# Patient Record
Sex: Female | Born: 1990 | Race: White | Hispanic: No | Marital: Married | Smoking: Never smoker
Health system: Southern US, Community
[De-identification: ages and names within clinical notes are randomized; demographics above are authoritative.]

## PROBLEM LIST (undated history)

## (undated) DIAGNOSIS — R011 Cardiac murmur, unspecified: Secondary | ICD-10-CM

## (undated) DIAGNOSIS — Q211 Atrial septal defect: Secondary | ICD-10-CM

## (undated) DIAGNOSIS — R002 Palpitations: Secondary | ICD-10-CM

## (undated) DIAGNOSIS — F419 Anxiety disorder, unspecified: Secondary | ICD-10-CM

## (undated) DIAGNOSIS — F32A Depression, unspecified: Secondary | ICD-10-CM

## (undated) HISTORY — DX: Depression, unspecified: F32.A

## (undated) HISTORY — DX: Atrial septal defect: Q21.1

## (undated) HISTORY — DX: Anxiety disorder, unspecified: F41.9

## (undated) HISTORY — DX: Palpitations: R00.2

## (undated) HISTORY — DX: Cardiac murmur, unspecified: R01.1

## (undated) HISTORY — PX: ASD REPAIR: SHX258

---
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
## 2014-05-05 ENCOUNTER — Ambulatory Visit (INDEPENDENT_AMBULATORY_CARE_PROVIDER_SITE_OTHER): Payer: BC Managed Care – PPO | Admitting: Cardiovascular Disease

## 2014-05-05 ENCOUNTER — Encounter: Payer: Self-pay | Admitting: Cardiovascular Disease

## 2014-05-05 DIAGNOSIS — R002 Palpitations: Secondary | ICD-10-CM

## 2014-05-05 DIAGNOSIS — Q211 Atrial septal defect, unspecified: Secondary | ICD-10-CM | POA: Insufficient documentation

## 2014-05-05 NOTE — Assessment & Plan Note (Signed)
3 years status post open ASD repair at [facility] 12/08/10 with a pericardial patch. She has had echoes since that time with documentation of return of her RV size. She no longer has dyspnea. She does have new onset palpitations. I cannot auscultate a murmur today. I'm going to get a 2-D echo with study to document successful closure and right-sided size and pressures.

## 2014-05-05 NOTE — Assessment & Plan Note (Signed)
New-onset palpitations over the last 6 months. The typically occur while she is at rest watching TV or at night before she goes to bed. They can last up to an hour at a time. There are no other associated symptoms. She drinks one cup of caffeine a day. She does not use stimulants. Her primary care physician to check lab work. I'm going to obtain a one-month event monitor and then we'll see her back.

## 2014-05-05 NOTE — Progress Notes (Signed)
05/05/2014 Kayla Everett   10/15/1990  [id number]  Primary Physician Astrid DivineGRIFFIN,ELAINE COLLINS, MD Primary Cardiologist: Runell GessJonathan J. Xaviera Flaten MD Roseanne RenoFACP,FACC,FAHA, FSCAI   HPI:  Kayla Everett is a delightful 23 year old thin and fit-appearing engaged Caucasian female with no children who is self-referred for evaluation of palpitations. Her father is Kayla Everett, a patient of mine as well. Her primary care physician is Dr. Maurice SmallElaine Griffin. 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 She actually got a four-year degree in [place] and equestrian science. She has no other medical problems. She is on no medications. She noticed dyspnea on exertion during her freshman year in college and went to see her school nurse who auscultated a murmur which led to a 2-D echo revealing an ASD. Ultimately this underwent surgical closure at the [facility] with a pericardial patch 12/08/10. She has done well since. She has noticed new onset palpitations over 6 months ago lasting up to one hour time. She drinks only a cup of caffeine a day and does not use other stimulants. There are no other associated symptoms. Her dyspnea has resolved since her surgery.   Current Outpatient Prescriptions  Medication Sig Dispense Refill  . MONONESSA 0.25-35 MG-MCG tablet Take 1 tablet by mouth daily.   0  . Multiple Vitamin (MULTIVITAMIN) capsule Take 1 capsule by mouth daily.     . Probiotic Product (PROBIOTIC DAILY PO) Take 1 tablet by mouth daily.     Marland Kitchen. zolpidem (AMBIEN) 5 MG tablet Take 5 mg by mouth at bedtime as needed.   0   No current facility-administered medications for this visit.    No Known Allergies  History   Social History  . Marital Status: Single    Spouse Name: N/A    Number of Children: N/A  . Years of Education: N/A   Occupational History  . Not on file.   Social History Main Topics  . Smoking status: Never Smoker   . Smokeless tobacco: Not on file  . Alcohol Use: Not on  file  . Drug Use: Not on file  . Sexual Activity: Not on file   Other Topics Concern  . Not on file   Social History Narrative  . No narrative on file     Review of Systems: General: negative for chills, fever, night sweats or weight changes.  Cardiovascular: negative for chest pain, dyspnea on exertion, edema, orthopnea, palpitations, paroxysmal nocturnal dyspnea or shortness of breath Dermatological: negative for rash Respiratory: negative for cough or wheezing Urologic: negative for hematuria Abdominal: negative for nausea, vomiting, diarrhea, bright red blood per rectum, melena, or hematemesis Neurologic: negative for visual changes, syncope, or dizziness All other systems reviewed and are otherwise negative except as noted above.    Blood pressure 100/68, pulse 65, height 5\' 2"  (1.575 m), weight 115 lb (52.164 kg).  General appearance: alert and no distress Neck: no adenopathy, no carotid bruit, no JVD, supple, symmetrical, trachea midline and thyroid not enlarged, symmetric, no tenderness/mass/nodules Lungs: clear to auscultation bilaterally Heart: regular rate and rhythm, S1, S2 normal, no murmur, click, rub or gallop Extremities: extremities normal, atraumatic, no cyanosis or edema  EKG normal sinus rhythm at 65 without ST or T-wave changes  ASSESSMENT AND PLAN:   Palpitations New-onset palpitations over the last 6 months. The typically occur while she is at rest watching TV or at night before she goes to bed. They can last up to an hour at a time. There are no  other associated symptoms. She drinks one cup of caffeine a day. She does not use stimulants. Her primary care physician to check lab work. I'm going to obtain a one-month event monitor and then we'll see her back.  Residual ASD (atrial septal defect) following repair 3 years status post open ASD repair at [facility] 12/08/10 with a pericardial patch. She has had echoes since that time with  documentation of return of her RV size. She no longer has dyspnea. She does have new onset palpitations. I cannot auscultate a murmur today. I'm going to get a 2-D echo with study to document successful closure and right-sided size and pressures.      Runell GessJonathan J. Ereka Brau MD FACP,FACC,FAHA, [facility] 05/05/2014 9:34 AM

## 2014-05-05 NOTE — Patient Instructions (Signed)
  We will see you back in follow up in 6 weeks with Dr Allyson SabalBerry.   Dr Allyson SabalBerry has ordered: 1.  Echocardiogram with bubble study. Echocardiography is a painless test that uses sound waves to create images of your heart. It provides your doctor with information about the size and shape of your heart and how well your heart's chambers and valves are working. This procedure takes approximately one hour. There are no restrictions for this procedure.  2.  Event monitor. Event monitors are medical devices that record the heart's electrical activity. Doctors most often us these monitors to diagnose arrhythmias. Arrhythmias are problems with the speed or rhythm of the heartbeat. The monitor is a small, portable device. You can wear one while you do your normal daily activities. This is usually used to diagnose what is causing palpitations/syncope (passing out).

## 2014-05-12 ENCOUNTER — Ambulatory Visit (HOSPITAL_COMMUNITY)
Admission: RE | Admit: 2014-05-12 | Discharge: 2014-05-12 | Disposition: A | Discharge status: Home / Self Care | Payer: BC Managed Care – PPO | Source: Ambulatory Visit | Attending: Cardiovascular Disease | Admitting: Cardiovascular Disease

## 2014-05-12 DIAGNOSIS — Q211 Atrial septal defect, unspecified: Secondary | ICD-10-CM

## 2014-05-12 DIAGNOSIS — I059 Rheumatic mitral valve disease, unspecified: Secondary | ICD-10-CM

## 2014-05-12 DIAGNOSIS — R002 Palpitations: Secondary | ICD-10-CM | POA: Insufficient documentation

## 2014-05-12 NOTE — Progress Notes (Signed)
2D Echo Performed 05/12/2014    Mayjor Ager, RCS  

## 2014-05-18 ENCOUNTER — Telehealth: Payer: Self-pay | Admitting: *Deleted

## 2014-05-18 NOTE — Telephone Encounter (Signed)
-----   Message from Runell GessJonathan J Berry, MD sent at 05/16/2014 11:20 AM EST ----- Essentially nl 2D with intact atrial septum, nl LV fxn, chiari network. I reviewed this with [place].

## 2014-05-18 NOTE — Telephone Encounter (Signed)
Spoke to pt. Notified her results were normal and that septum was intact.

## 2014-06-02 ENCOUNTER — Telehealth: Payer: Self-pay | Admitting: Cardiovascular Disease

## 2014-06-02 NOTE — Telephone Encounter (Signed)
Returned call to patient. She wanted to know if she will need to come to her f/up appmt in Jan (for echo and monitor) as she is now planning on moving to [place] before that date. She would like to be contacted with her monitor results once they are available and is OK to f/up after the monitor, if necessary (with extender? or Dr. Allyson SabalBerry if available).   Will route to [place], Charity fundraiserN as Lorain ChildesFYI

## 2014-06-02 NOTE — Telephone Encounter (Signed)
Pt wants to know if she have to come in for her appt for results of her monitor?She wants to know if Dr Chauncey MannBeryy could just talk to her over the phone?

## 2014-06-16 ENCOUNTER — Encounter: Payer: Self-pay | Admitting: Cardiovascular Disease

## 2014-06-16 ENCOUNTER — Ambulatory Visit (INDEPENDENT_AMBULATORY_CARE_PROVIDER_SITE_OTHER): Payer: BC Managed Care – PPO | Admitting: Cardiovascular Disease

## 2014-06-16 VITALS — BP 114/78 | HR 60 | Ht 62.0 in | Wt 116.4 lb

## 2014-06-16 DIAGNOSIS — Q211 Atrial septal defect, unspecified: Secondary | ICD-10-CM

## 2014-06-16 DIAGNOSIS — R002 Palpitations: Secondary | ICD-10-CM

## 2014-06-16 NOTE — Patient Instructions (Signed)
Please follow up with Dr. Berry as needed. 

## 2014-06-16 NOTE — Assessment & Plan Note (Signed)
History of ASD repair 12/08/10. Recent 2-D echocardiogram performed in our practice 05/12/14 revealed normal LV and RV size and function with intact interatrial septum and a negative bubble study.

## 2014-06-16 NOTE — Progress Notes (Signed)
Kayla Everett returns today for follow-up of her 2-D echo and event monitor. The echo was entirely normal suggesting intact interatrial septum with a negative bubble study, normal LV and RV size and function. The monitor showed what appears to be atrial tachycardia with rates up as high as 160-180. She is scheduled to move to [place] January 1 to be with her fiance. I have made an appointment for her to see Dr. Hillis RangeJames Allred tomorrow and I will see her back when necessary  Runell GessJonathan J. Berry, M.D., FACP, [facility]ACC, Earl LagosFAHA, [facility] [address] [place], [place]  [id number]  [phone] 06/16/2014 4:09 PM

## 2014-06-16 NOTE — Assessment & Plan Note (Signed)
Event monitor showed runs of what appears to be atrial tachycardia. I am referring her to Dr. Hillis RangeJames Allred for EP evaluation.

## 2014-06-17 ENCOUNTER — Encounter: Payer: Self-pay | Admitting: Internal Medicine

## 2014-06-17 ENCOUNTER — Telehealth: Payer: Self-pay | Admitting: Internal Medicine

## 2014-06-17 ENCOUNTER — Ambulatory Visit (INDEPENDENT_AMBULATORY_CARE_PROVIDER_SITE_OTHER): Payer: BC Managed Care – PPO | Admitting: Internal Medicine

## 2014-06-17 VITALS — BP 96/70 | HR 82 | Ht 62.0 in | Wt 115.8 lb

## 2014-06-17 DIAGNOSIS — Q211 Atrial septal defect, unspecified: Secondary | ICD-10-CM

## 2014-06-17 DIAGNOSIS — I471 Supraventricular tachycardia: Secondary | ICD-10-CM

## 2014-06-17 DIAGNOSIS — R002 Palpitations: Secondary | ICD-10-CM

## 2014-06-17 DIAGNOSIS — R000 Tachycardia, unspecified: Secondary | ICD-10-CM

## 2014-06-17 MED ORDER — VERAPAMIL HCL 40 MG PO TABS
ORAL_TABLET | ORAL | Status: DC
Start: 2014-06-17 — End: 2020-04-09

## 2014-06-17 NOTE — Telephone Encounter (Signed)
Incoming records received on 12[facility]16.15 [facility] for today's appointment with Dr. Johney FrameAllred: Given to the chart prep team on 12.16.15:djc

## 2014-06-17 NOTE — Patient Instructions (Addendum)
Your physician recommends that you schedule a follow-up appointment as needed  Your physician has recommended you make the following change in your medication:  1) Verapamil 40 mg take 1-2 by mouth as needed for fast heart rates as needed

## 2014-06-17 NOTE — Progress Notes (Deleted)
Primary Care Physician: Astrid DivineGRIFFIN,ELAINE COLLINS, MD Referring Physician: Dr. Leeanne RioBerry   Kayla Everett is a 23 y.o. female with a h/o ASD repair 6/12 and recent tachycardia, otherwise healthy, recently seen and referred by Dr. Allyson SabalBerry. She noticed palpitations the last 6-9 months when they first started up to an hour, lately having just a few minutes. She is aware of fluttering in her chest but no presyncope/syn, copy/dizziness. She recently had an echo with contrast which was negative. 30 day monitor did show a couple episodes with fast heart rate, appear to be sinus tachycardia, 160-180, but with one episode, she believes she was running and the other episode she does not remember her activity. She will usually notice palps with rest or stress more so than  activity.  Today, she denies symptoms of  chest pain, shortness of breath, orthopnea, PND, lower extremity edema, , presyncope, syncope, or neurologic sequela. Palpitations as above and intermittent dizziness with prolonged standing which she contributes to having low blood pressure. The patient is tolerating medications without difficulties and is otherwise without complaint today.   Past Medical History  Diagnosis Date  . Residual ASD (atrial septal defect) following repair june 2012    [facility]  . Palpitations    No past surgical history on file.  Current Outpatient Prescriptions  Medication Sig Dispense Refill  . MONONESSA 0.25-35 MG-MCG tablet Take 1 tablet by mouth daily.   0  . Multiple Vitamin (MULTIVITAMIN) capsule Take 1 capsule by mouth daily.     . Probiotic Product (PROBIOTIC DAILY PO) Take 1 tablet by mouth daily.     Marland Kitchen. zolpidem (AMBIEN) 5 MG tablet Take 5 mg by mouth at bedtime as needed for sleep.   0   No current facility-administered medications for this visit.    No Known Allergies  History   Social History  . Marital Status: Single    Spouse Name: N/A    Number of Children: N/A  . Years of Education:  N/A   Occupational History  . Not on file.   Social History Main Topics  . Smoking status: Never Smoker   . Smokeless tobacco: Not on file  . Alcohol Use: Not on file  . Drug Use: Not on file  . Sexual Activity: Not on file   Other Topics Concern  . Not on file   Social History Narrative    Family History  Problem Relation Age of Onset  . Hyperlipidemia Father   . Heart disease Father   . Hyperlipidemia Paternal Grandfather   . Heart disease Paternal Grandfather     ROS- All systems are reviewed and negative except as per the HPI above  Physical Exam: Filed Vitals:   06/17/14 1305  BP: 96/70  Pulse: 82  Height: 5\' 2"  (1.575 m)  Weight: 115 lb 12.8 oz (52.527 kg)    GEN- The patient is well appearing, alert and oriented x 3 today.   Head- normocephalic, atraumatic Eyes-  Sclera clear, conjunctiva pink Ears- hearing intact Oropharynx- clear Neck- supple, no JVP Lymph- no cervical lymphadenopathy Lungs- Clear to ausculation bilaterally, normal work of breathing Heart- Regular rate and rhythm, no murmurs, rubs or gallops, PMI not laterally displaced GI- soft, NT, ND, + BS Extremities- no clubbing, cyanosis, or edema MS- no significant deformity or atrophy Skin- no rash or lesion Psych- euthymic mood, full affect Neuro- strength and sensation are intact  30 day monitor reviewed and as described in the HPI.  Assessment and Plan: 1.  ASD s/p repair Recent echo without shunting.  2. Palpitations/tachycardia Currently minimally symptomatic. Monitor shows 1-2 episodes of sinus tachycardia, one of which may have been associated with exercise, the other she does not remember and was not symptomatic. Both heart rates within normal range of 23 yr old and not life threatening. No other work up in needed at this time and was given verapamil 40 mg to take 1-2 tabs as needed for palps. She is moving to [place] the first of the year and has been encouraged to seek out a EP  there if further issues.

## 2014-06-18 ENCOUNTER — Encounter: Payer: Self-pay | Admitting: Internal Medicine

## 2014-06-18 NOTE — Progress Notes (Signed)
Primary Care Physician: Astrid DivineGRIFFIN,ELAINE COLLINS, MD Referring Physician: Dr. Leeanne RioBerry   Kayla Everett is a 23 y.o. female with a h/o ASD repair 6/12 referred by Dr. Allyson SabalBerry for further evaluation of palpitations. She noticed palpitations 6-9 months ago.  She reports gradual onset/ offset of palpitations.  Episodes typically last only several minutes.  She describes symptoms of fluttering in her chest but no presyncope/syn, copy/dizziness. She recently had an echo which did not reveal any significant structural abnormality.  She recently had an event monitor placed by Dr Allyson SabalBerry which revealed long RP tachycardia (likely sinus tachycardia, 160-180 bpm), but with one episode, she believes she was running and the other episode she does not remember her activity.  She is unaware of triggers or precipitants for her symptoms. Today, she denies symptoms of  chest pain, shortness of breath, orthopnea, PND, lower extremity edema, , presyncope, syncope, or neurologic sequela. Palpitations as above and intermittent dizziness with prolonged standing which she contributes to having low blood pressure. The patient is tolerating medications without difficulties and is otherwise without complaint today.   Past Medical History  Diagnosis Date  . Residual ASD (atrial septal defect) following repair june 2012    [facility]  . Palpitations    No past surgical history on file.  Current Outpatient Prescriptions  Medication Sig Dispense Refill  . MONONESSA 0.25-35 MG-MCG tablet Take 1 tablet by mouth daily.   0  . Multiple Vitamin (MULTIVITAMIN) capsule Take 1 capsule by mouth daily.     . Probiotic Product (PROBIOTIC DAILY PO) Take 1 tablet by mouth daily.     Marland Kitchen. zolpidem (AMBIEN) 5 MG tablet Take 5 mg by mouth at bedtime as needed for sleep.   0   No current facility-administered medications for this visit.    No Known Allergies  History   Social History  . Marital Status: Single    Spouse Name: N/A   Number of Children: N/A  . Years of Education: N/A   Occupational History  . Not on file.   Social History Main Topics  . Smoking status: Never Smoker   . Smokeless tobacco: Not on file  . Alcohol Use: Not on file  . Drug Use: Not on file  . Sexual Activity: Not on file   Other Topics Concern  . Not on file   Social History Narrative    Family History  Problem Relation Age of Onset  . Hyperlipidemia Father   . Heart disease Father   . Hyperlipidemia Paternal Grandfather   . Heart disease Paternal Grandfather     ROS- All systems are reviewed and negative except as per the HPI above  Physical Exam: Filed Vitals:   06/17/14 1305  BP: 96/70  Pulse: 82  Height: 5\' 2"  (1.575 m)  Weight: 115 lb 12.8 oz (52.527 kg)    GEN- The patient is well appearing, alert and oriented x 3 today.   Head- normocephalic, atraumatic Eyes-  Sclera clear, conjunctiva pink Ears- hearing intact Oropharynx- clear Neck- supple, no JVP Lymph- no cervical lymphadenopathy Lungs- Clear to ausculation bilaterally, normal work of breathing Heart- Regular rate and rhythm, wide S2 split. no murmurs, rubs or gallops, PMI not laterally displaced GI- soft, NT, ND, + BS Extremities- no clubbing, cyanosis, or edema MS- no significant deformity or atrophy Skin- no rash or lesion Psych- euthymic mood, full affect Neuro- strength and sensation are intact  30 day monitor reviewed and as described in the HPI. Echo as above Epic  records including Dr Renelda MomBerrys notes are discussed.  I also spoke with Dr Allyson SabalBerry about the patient.  Assessment and Plan: 1. Tachypalpitations I have reviewed the patients event monitor.  This reveals long RP tachycardia.  I am most susipcious of sinus tachycardia but cannot rule out an atrial tachycardia, particularly with her history of ASD open closure.  Therapeutic strategies for supraventricular tachycardia including medicine and ablation were discussed in detail with the patient  today. Risk, benefits, and alternatives to EP study and radiofrequency ablation were also discussed in detail today.  She is clear that she is not interested in EPS or ablation.  She is also not interested in daily medicine.  Given the short and infrequent nature of her symptoms, I have advised veramapil 40mg  prn as a "pill in pocket" approach for her.  She is moving to [place] next month.  She will follow with cardiology as needed there.  1. S/p ASD repair Recent echo without shunting.  No further workup at this time.   I will see as needed going forward.  She is aware that she may contact my office if I can assist further in the future.

## 2014-07-10 ENCOUNTER — Ambulatory Visit: Payer: BC Managed Care – PPO | Admitting: Cardiovascular Disease

## 2020-03-03 LAB — OB RESULTS CONSOLE GC/CHLAMYDIA
Chlamydia: NEGATIVE
Gonorrhea: NEGATIVE

## 2020-03-03 LAB — OB RESULTS CONSOLE RPR: RPR: NONREACTIVE

## 2020-03-03 LAB — OB RESULTS CONSOLE ABO/RH: RH Type: NEGATIVE

## 2020-03-03 LAB — OB RESULTS CONSOLE ANTIBODY SCREEN: Antibody Screen: NEGATIVE

## 2020-03-03 LAB — OB RESULTS CONSOLE HEPATITIS B SURFACE ANTIGEN: Hepatitis B Surface Ag: NEGATIVE

## 2020-03-03 LAB — OB RESULTS CONSOLE RUBELLA ANTIBODY, IGM: Rubella: IMMUNE

## 2020-03-03 LAB — OB RESULTS CONSOLE GBS: GBS: POSITIVE

## 2020-03-03 LAB — OB RESULTS CONSOLE HIV ANTIBODY (ROUTINE TESTING): HIV: NONREACTIVE

## 2020-03-15 ENCOUNTER — Ambulatory Visit: Payer: Self-pay | Admitting: Cardiovascular Disease

## 2020-03-24 ENCOUNTER — Ambulatory Visit: Payer: Self-pay | Admitting: Nurse Practitioner

## 2020-04-04 ENCOUNTER — Other Ambulatory Visit: Payer: Self-pay

## 2020-04-09 ENCOUNTER — Other Ambulatory Visit: Payer: Self-pay

## 2020-04-09 ENCOUNTER — Encounter: Payer: Self-pay | Admitting: Cardiovascular Disease

## 2020-04-09 ENCOUNTER — Ambulatory Visit (INDEPENDENT_AMBULATORY_CARE_PROVIDER_SITE_OTHER): Payer: PRIVATE HEALTH INSURANCE | Admitting: Cardiovascular Disease

## 2020-04-09 VITALS — BP 133/79 | HR 60 | Ht 62.0 in | Wt 123.0 lb

## 2020-04-09 DIAGNOSIS — Q211 Atrial septal defect, unspecified: Secondary | ICD-10-CM

## 2020-04-09 DIAGNOSIS — R002 Palpitations: Secondary | ICD-10-CM

## 2020-04-09 NOTE — Assessment & Plan Note (Signed)
History of palpitations in the past.  She thought that most this was related to anxiety.  She did have a event monitor that showed long RP tachycardia and was seen by Dr. Johney Frame who thought that she may have an atrial tachycardia.  He discussed medications and/or ablation both of which were not pursued.  She is now 4 months pregnant and has noticed recurrent palpitations which I suspect is related to her pregnancy.

## 2020-04-09 NOTE — Progress Notes (Signed)
04/09/2020 Kayla Everett   02/24/1991  [id number]  Primary Physician Maurice Small, MD Primary Cardiologist: Runell Gess MD Nicholes Calamity, [place]  HPI:  Kayla Everett is a 29 y.o.  thin and fit-appearing married Caucasian female who is 4 months pregnant and was self-referred initially for evaluation of palpitations. Her father is Kayla Everett, a patient of mine as well. Her primary care physician is Dr. Maurice Small. She works as a Air traffic controller. She actually got a four-year degree in [place] and equestrian science. She has no other medical problems. She is on no medications. She noticed dyspnea on exertion during her freshman year in college and went to see her school nurse who auscultated a murmur which led to a 2-D echo revealing an ASD. Ultimately this underwent surgical closure at the [facility] with a pericardial patch 12/08/10. She has done well since. She has noticed new onset palpitations over 6 months ago lasting up to one hour time. She drinks only a cup of caffeine a day and does not use other stimulants. There are no other associated symptoms. Her dyspnea has resolved since her surgery.  Since I saw her 6 years ago she has since gotten married.  She moved back to [place].  I did do a 2D echo on her 05/12/2014 that showed no interatrial shunt by bubble study and normal LV function with moderate left atrial enlargement.  She is currently working as a Energy manager.  She has had some dyspnea probably as result of her pregnancy and some increased palpitations as well.   Current Meds  Medication Sig  . Prenatal Vit-Fe Fumarate-FA (MULTIVITAMIN-PRENATAL) 27-0.8 MG TABS tablet Take 1 tablet by mouth daily at 12 noon.     No Known Allergies  Social History   Socioeconomic History  . Marital status: Single    Spouse name: Not on file  . Number of children: Not on file  . Years of education: Not on file  . Highest  education level: Not on file  Occupational History  . Not on file  Tobacco Use  . Smoking status: Never Smoker  . Smokeless tobacco: Never Used  Substance and Sexual Activity  . Alcohol use: No    Alcohol/week: 0.0 standard drinks  . Drug use: No  . Sexual activity: Not on file  Other Topics Concern  . Not on file  Social History Narrative  . Not on file   Social Determinants of Health   Financial Resource Strain:   . Difficulty of Paying Living Expenses: Not on file  Food Insecurity:   . Worried About Programme researcher, broadcasting/film/video in the Last Year: Not on file  . Ran Out of Food in the Last Year: Not on file  Transportation Needs:   . Lack of Transportation (Medical): Not on file  . Lack of Transportation (Non-Medical): Not on file  Physical Activity:   . Days of Exercise per Week: Not on file  . Minutes of Exercise per Session: Not on file  Stress:   . Feeling of Stress : Not on file  Social Connections:   . Frequency of Communication with Friends and Family: Not on file  . Frequency of Social Gatherings with Friends and Family: Not on file  . Attends Religious Services: Not on file  . Active Member of Clubs or Organizations: Not on file  . Attends Banker Meetings: Not on file  . Marital Status: Not on  file  Intimate Partner Violence:   . Fear of Current or Ex-Partner: Not on file  . Emotionally Abused: Not on file  . Physically Abused: Not on file  . Sexually Abused: Not on file     Review of Systems: General: negative for chills, fever, night sweats or weight changes.  Cardiovascular: negative for chest pain, dyspnea on exertion, edema, orthopnea, palpitations, paroxysmal nocturnal dyspnea or shortness of breath Dermatological: negative for rash Respiratory: negative for cough or wheezing Urologic: negative for hematuria Abdominal: negative for nausea, vomiting, diarrhea, bright red blood per rectum, melena, or hematemesis Neurologic: negative for visual  changes, syncope, or dizziness All other systems reviewed and are otherwise negative except as noted above.    Blood pressure 133/79, pulse 60, height 5\' 2"  (1.575 m), weight 123 lb (55.8 kg), last menstrual period 12/17/2019, SpO2 100 %.  General appearance: alert and no distress Neck: no adenopathy, no carotid bruit, no JVD, supple, symmetrical, trachea midline and thyroid not enlarged, symmetric, no tenderness/mass/nodules Lungs: clear to auscultation bilaterally Heart: regular rate and rhythm, S1, S2 normal, no murmur, click, rub or gallop Extremities: extremities normal, atraumatic, no cyanosis or edema Pulses: 2+ and symmetric Skin: Skin color, texture, turgor normal. No rashes or lesions Neurologic: Alert and oriented X 3, normal strength and tone. Normal symmetric reflexes. Normal coordination and gait  EKG sinus rhythm at 60 with rightward axis with nonspecific ST and T wave changes.  I personally reviewed this EKG.  ASSESSMENT AND PLAN:   Palpitations History of palpitations in the past.  She thought that most this was related to anxiety.  She did have a event monitor that showed long RP tachycardia and was seen by Dr. 12/19/2019 who thought that she may have an atrial tachycardia.  He discussed medications and/or ablation both of which were not pursued.  She is now 4 months pregnant and has noticed recurrent palpitations which I suspect is related to her pregnancy.  Residual ASD (atrial septal defect) following repair History of ASD repair via open closure at [facility] in [facility] treated with a pericardial patch 12/08/2010.  She had a 2D echo performed by 02/07/2011 05/12/2014 that showed normal LV function without evidence of an interatrial shunt by bubble study.  Multiple echo was performed while she was in 13/04/2014 again showed essentially normal 2D echo with moderate left atrial enlargement and no evidence of intra-atrial shunt.  We will recheck a 2D echo in 6 months when I  see her back.      [place] MD FACP,FACC,FAHA, [facility] 04/09/2020 2:37 PM

## 2020-04-09 NOTE — Patient Instructions (Signed)

## 2020-04-09 NOTE — Assessment & Plan Note (Signed)
History of ASD repair via open closure at [facility] in [place] treated with a pericardial patch 12/08/2010.  She had a 2D echo performed by [place] 05/12/2014 that showed normal LV function without evidence of an interatrial shunt by bubble study.  Multiple echo was performed while she was in [place] again showed essentially normal 2D echo with moderate left atrial enlargement and no evidence of intra-atrial shunt.  We will recheck a 2D echo in 6 months when I see her back.

## 2020-04-20 ENCOUNTER — Encounter: Payer: Self-pay | Admitting: Physical Therapy

## 2020-04-20 ENCOUNTER — Other Ambulatory Visit: Payer: Self-pay

## 2020-04-20 ENCOUNTER — Ambulatory Visit: Payer: PRIVATE HEALTH INSURANCE | Attending: Obstetrics and Gynecology | Admitting: Physical Therapy

## 2020-04-20 DIAGNOSIS — M6281 Muscle weakness (generalized): Secondary | ICD-10-CM | POA: Diagnosis present

## 2020-04-20 DIAGNOSIS — M545 Low back pain, unspecified: Secondary | ICD-10-CM | POA: Diagnosis not present

## 2020-04-20 DIAGNOSIS — G8929 Other chronic pain: Secondary | ICD-10-CM

## 2020-04-20 DIAGNOSIS — R278 Other lack of coordination: Secondary | ICD-10-CM

## 2020-04-20 DIAGNOSIS — M25551 Pain in right hip: Secondary | ICD-10-CM

## 2020-04-20 DIAGNOSIS — M25552 Pain in left hip: Secondary | ICD-10-CM | POA: Diagnosis present

## 2020-04-20 NOTE — Therapy (Signed)
[facility] [address], [address] [place], [place], [address] Phone: [phone]   Fax:  [phone]  Physical Therapy Evaluation  Patient Details  Name: Kayla Everett MRN: [id number] Date of Birth: 1991/04/18 Referring Provider (PT): Nigel Bridgeman, [place]   Encounter Date: 04/20/2020    Past Medical History:  Diagnosis Date  . Palpitations   . Residual ASD (atrial septal defect) following repair june 2012   [facility]    Past Surgical History:  Procedure Laterality Date  . ASD REPAIR      There were no vitals filed for this visit.    Subjective Assessment - 04/20/20 1441    Subjective Pt is referred to OPPT to learn how to manage PF muscle dysfunction as she preps for childbirth in 08/2020.  She is currently [redacted]w[redacted]d pregnant.  She rides horses 3-5x/week.  Pt has bil SI joint pain which locks up and LBP x years. 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  Riding sometimes helps pain.  Prolonged sitting is aggravting.    Pertinent History ASD repair, currently pregnant, scoliosis    Limitations Sitting    How long can you sit comfortably? depends on the day    Patient Stated Goals manage symptoms    Currently in Pain? Yes    Pain Score 1     Pain Location Back    Pain Orientation Right;Left    Pain Descriptors / Indicators Aching;Dull    Pain Type Chronic pain    Pain Radiating Towards SI joints and hips    Pain Onset More than a month ago    Pain Frequency Intermittent    Aggravating Factors  prolonged sitting    Pain Relieving Factors heat, stretching, exercise              [facility] PT Assessment - 04/20/20 0001      Assessment   Medical Diagnosis M62.9 (ICD-10-CM) - Disorder of muscle, unspecified    Referring Provider (PT) Nigel Bridgeman, CNM    Onset Date/Surgical Date --   years of SI joint dysfunction, currently pregnant [redacted]w[redacted]d   Next MD Visit --   next week   Prior Therapy yes chiro and  PF PT      Precautions   Precautions Other (comment)    Precaution Comments pregnant      Restrictions   Weight Bearing Restrictions No      Balance Screen   Has the patient fallen in the past 6 months No      Home Environment   Living Environment Private residence    Living Arrangements Spouse/significant other    Type of Home House    Additional Comments pain with stairs if really flared up      Prior Function   Level of Independence Independent    Vocation Full time employment    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    Leisure horseback riding, walk, travel      Cognition   Overall Cognitive Status Within Functional Limits for tasks assessed      Functional Tests   Functional tests Single leg stance      Single Leg Stance   Comments + Trendelenburg in Lt SLS      Posture/Postural Control   Posture Comments mild scoliosis present      ROM / Strength   AROM / PROM / Strength AROM;Strength;PROM      AROM   Overall AROM Comments trunk limited  bil SB 10 deg each, Rt SI pain with Lt SB      PROM   Overall PROM Comments WNL      Strength   Overall Strength Comments Lt hip IR/ER 4-/5, Lt closed chain hip 4-/5, Lt lumbar MF atrophy present L3-S1      Flexibility   Soft Tissue Assessment /Muscle Length yes   adductors and hamstrings limited Rt compared to Lt end range     Palpation   SI assessment  unlocks on Lt in SLS with + Trendelenburg on Lt, pain improves with manual approx in supine, Rt SI joint limited glide    Palpation comment Lt>Rt lateral hip tenderness with TPs in glute min/med and piriformis                      Objective measurements completed on examination: See above findings.     Pelvic Floor Special Questions - 04/20/20 0001    Prior Pelvic/Prostate Exam Yes    Date of Last Pelvic/Prostate Exam --   2019   Result Pelvic/Prostate Exam  normal    Are you Pregnant or attempting pregnancy? Yes    Prior Pregnancies No    Number  of Pregnancies 1    Currently Sexually Active Yes    Is this Painful No    History of sexually transmitted disease No    Urinary Leakage No    Urinary urgency No    Urinary frequency yes    Fecal incontinence No    Fluid intake water    Caffeine beverages 1 cup    Falling out feeling (prolapse) No                      PT Short Term Goals - 04/20/20 1732      PT SHORT TERM GOAL #1   Title Pt will be ind with Rt LE flexibility aspect of HEP to reduce undue strain through Rt hemipelvis.    Time 3    Period Weeks    Status New    Target Date 05/11/20      PT SHORT TERM GOAL #2   Title Pt will demo improved Lt hemipelvic stability in closed chain SLS without Lt SI joint unlocking and eliminated Trendelenburg.    Time 4    Period Weeks    Status New    Target Date 05/18/20             PT Long Term Goals - 04/20/20 1734      PT LONG TERM GOAL #1   Title Pt will be ind with advanced HEP and understand how to safely progress    Time 12    Period Weeks    Status New    Target Date 07/13/20      PT LONG TERM GOAL #2   Title Pt will report at least 50% reduction in pain intensity and frequency with daily activities.    Time 12    Period Weeks    Status New    Target Date 07/13/20      PT LONG TERM GOAL #3   Title Pt will be educated on ways to support her spine and pelvis as her pregnancy progresses to protect her from pain and maximize her safety with mobility.    Time 12    Period Weeks    Status New    Target Date 07/13/20      PT LONG TERM GOAL #4  Title Pt will achieve Lt hip strength in rotators and in closed chain functional loading to at least 4+/5 for improved transfers and stairs    Time 12    Period Weeks    Status New    Target Date 07/13/20                  Plan - 04/20/20 1721    Clinical Impression Statement Pt is a healthy 29yo female who is 7369w6d pregnant with her first child.  She is an avid horseback rider with a long  history of LBP, bil SI joint dysfunction and bil hip pain.  She plans to continue riding 3-5 days a week as able during her pregnancy.  She has no symptoms of dysfunction of pelvic floor muscles (leakage, pain) and PT was able to palpate good contract and bulge through clothing in SL today.  Pt presents with weakness in Lt hip and difficulty stabilizing Lt hip and SI joint (unlocks) in closed chain.  She has + Trendelenburg on Lt with SLS.  She has trigger points in Lt lateral hip in glut min/med and piriformis today.  Rt SI joint is mildly compressed.  LE flexibility is limited on Rt in hamstrings and adductors.  She has some atrophy and weakness in Lt lumbar multifidi.  She has mild scoliosis.  She has had success in past with ART and stretching/ther ex to help prevent her SI joints from locking up.  Today she felt relief with Lt manual SI joint compression so strengthening and tape/SI belt may be helpful to her.  She will benefit from skilled PT to address her symptoms and help manage her pain and stability as her pregnancy progresses.    Personal Factors and Comorbidities Comorbidity 1    Comorbidities open heart surgery when younger, considered high risk pregnancy due to this history    Examination-Activity Limitations Sit;Bend;Stairs    Examination-Participation Restrictions Driving;Community Activity    Stability/Clinical Decision Making Stable/Uncomplicated    Clinical Decision Making Low    Rehab Potential Excellent    PT Frequency 1x / week    PT Duration 12 weeks    PT Treatment/Interventions ADLs/Self Care Home Management;Joint Manipulations;Spinal Manipulations;Taping;Dry needling;Passive range of motion;Manual techniques;Neuromuscular re-education;Therapeutic exercise;Therapeutic activities;Functional mobility training;Stair training;Biofeedback;Moist Heat;Cryotherapy    PT Next Visit Plan Rt ham/adductor stretch and butterfly (add to HEP), STM/release Lt lateral hip then work on Lt SI  stabilization, bil lumbar myofascial release and ball rollouts, try SI belt    PT Home Exercise Plan Access Code: [email or web address] (clam, reverse clam, standing weight shifts/SLS foot on step)    Consulted and Agree with Plan of Care Patient           Patient will benefit from skilled therapeutic intervention in order to improve the following deficits and impairments:  Decreased coordination, Decreased range of motion, Increased fascial restricitons, Pain, Increased muscle spasms, Decreased strength, Postural dysfunction, Impaired flexibility, Improper body mechanics  Visit Diagnosis: Chronic bilateral low back pain without sciatica - Plan: PT plan of care cert/re-cert  Muscle weakness (generalized) - Plan: PT plan of care cert/re-cert  Other lack of coordination - Plan: PT plan of care cert/re-cert  Pain in left hip - Plan: PT plan of care cert/re-cert  Pain in right hip - Plan: PT plan of care cert/re-cert     Problem List Patient Active Problem List   Diagnosis Date Noted  . Palpitations 05/05/2014  . Residual ASD (atrial septal defect) following repair 05/05/2014  Loistine Simas Bunnie Rehberg, PT 04/20/20 5:40 PM   [facility] [address], [address] [place], [place], [address] Phone: [phone]   Fax:  [phone]  Name: Kayla Everett MRN: [id number] Date of Birth: 1990/11/17

## 2020-04-20 NOTE — Patient Instructions (Signed)
Access Code: [email or web address] URL: [email or web address] Date: 04/20/2020 Prepared by: Loistine Simas Mischelle Reeg  Exercises Clamshell - 1 x daily - 7 x weekly - 2 sets - 10 reps Sidelying Reverse Clamshell - 1 x daily - 7 x weekly - 2 sets - 10 reps Standing Weight Shift - 1 x daily - 7 x weekly - 1 sets - 20 reps

## 2020-04-21 ENCOUNTER — Ambulatory Visit: Payer: Self-pay | Admitting: Nurse Practitioner

## 2020-04-29 ENCOUNTER — Encounter: Payer: Self-pay | Admitting: Physical Therapy

## 2020-04-29 ENCOUNTER — Other Ambulatory Visit: Payer: Self-pay

## 2020-04-29 ENCOUNTER — Ambulatory Visit: Payer: PRIVATE HEALTH INSURANCE | Admitting: Physical Therapy

## 2020-04-29 DIAGNOSIS — M25551 Pain in right hip: Secondary | ICD-10-CM

## 2020-04-29 DIAGNOSIS — R278 Other lack of coordination: Secondary | ICD-10-CM

## 2020-04-29 DIAGNOSIS — M545 Low back pain, unspecified: Secondary | ICD-10-CM

## 2020-04-29 DIAGNOSIS — M6281 Muscle weakness (generalized): Secondary | ICD-10-CM

## 2020-04-29 DIAGNOSIS — M25552 Pain in left hip: Secondary | ICD-10-CM

## 2020-04-29 DIAGNOSIS — G8929 Other chronic pain: Secondary | ICD-10-CM

## 2020-04-29 NOTE — Patient Instructions (Signed)
Access Code: [email or web address] URL: [email or web address] Date: 04/29/2020 Prepared by: Loistine Simas Macel Yearsley  Exercises Clamshell - 1 x daily - 7 x weekly - 2 sets - 10 reps Sidelying Reverse Clamshell - 1 x daily - 7 x weekly - 2 sets - 10 reps Standing Weight Shift - 1 x daily - 7 x weekly - 1 sets - 20 reps Beginner Bridge - 1 x daily - 7 x weekly - 1 sets - 10 reps Hooklying Isometric Clamshell - 1 x daily - 7 x weekly - 1 sets - 5 reps Supine Bilateral Hip Internal Rotation Stretch - 1 x daily - 7 x weekly - 1 sets - 1 reps - 30 hold Supine Hamstring Stretch with Strap - 1 x daily - 7 x weekly - 1 sets - 1 reps - 30 hold

## 2020-04-29 NOTE — Therapy (Signed)
[facility] [address], [address] [place], [place], [address] Phone: [phone]   Fax:  [phone]  Physical Therapy Treatment  Patient Details  Name: Kayla Everett MRN: [id number] Date of Birth: 09-08-1990 Referring Provider (PT): Nigel Bridgeman, [place]   Encounter Date: 04/29/2020   PT End of Session - 04/29/20 1535    Visit Number 2    Number of Visits 20    Date for PT Re-Evaluation 07/13/20    Authorization Type Generic Commercial    Authorization Time Period through 07/02/20    Authorization - Visit Number 2    Authorization - Number of Visits 20    PT Start Time 1536    PT Stop Time 1615    PT Time Calculation (min) 39 min    Activity Tolerance Patient tolerated treatment well    Behavior During Therapy [facility] for tasks assessed/performed           Past Medical History:  Diagnosis Date  . Palpitations   . Residual ASD (atrial septal defect) following repair june 2012   [facility]    Past Surgical History:  Procedure Laterality Date  . ASD REPAIR      There were no vitals filed for this visit.   Subjective Assessment - 04/29/20 1536    Subjective No pain today - just Lt lower SI joint tightness.  I'm really tired and we found I have low vit D.  Will be taking supplements.    Pertinent History ASD repair, currently pregnant, scoliosis    How long can you sit comfortably? depends on the day    Patient Stated Goals manage symptoms    Currently in Pain? No/denies                             [facility] Adult PT Treatment/Exercise - 04/29/20 0001      Self-Care   Self-Care Other Self-Care Comments    Other Self-Care Comments  trial of demo [place]ola belt, gave info should Pt wish to purchace      Exercises   Exercises Lumbar;Knee/Hip      Lumbar Exercises: Stretches   Active Hamstring Stretch Left;Right;1 rep;30 seconds    Active Hamstring Stretch Limitations with yoga strap, added  adductors 1x30 bil    Other Lumbar Stretch Exercise supine butterfly with pillow supports at knees, ribcage expansion breaths x 5 cycles    Other Lumbar Stretch Exercise supine hip IR stretch Rt/Lt 1x30 each      Lumbar Exercises: Supine   Bridge Compliant;10 reps    Other Supine Lumbar Exercises single leg supine clam yellow loop band with focus on square pelvis and stabilizing hip x 10 bil      Lumbar Exercises: Sidelying   Clam Left;Right;Both;5 reps    Clam Limitations and reverse clam (blue loop), yellow loop clam      Lumbar Exercises: Quadruped   Madcat/Old Horse 5 reps    Other Quadruped Lumbar Exercises quadruped sidebending with lateral costal expansion x 3 breath cycles each, 2 rounds    Other Quadruped Lumbar Exercises child's pose neutral and with SB bil 1x20 sec each way      Manual Therapy   Manual Therapy Soft tissue mobilization    Soft tissue mobilization Lt gluteals and piriformis                  PT Education - 04/29/20 1606    Education  Details Access Code: [email or web address]    Person(s) Educated Patient    Methods Explanation;Handout    Comprehension Verbalized understanding            PT Short Term Goals - 04/20/20 1732      PT SHORT TERM GOAL #1   Title Pt will be ind with Rt LE flexibility aspect of HEP to reduce undue strain through Rt hemipelvis.    Time 3    Period Weeks    Status New    Target Date 05/11/20      PT SHORT TERM GOAL #2   Title Pt will demo improved Lt hemipelvic stability in closed chain SLS without Lt SI joint unlocking and eliminated Trendelenburg.    Time 4    Period Weeks    Status New    Target Date 05/18/20             PT Long Term Goals - 04/20/20 1734      PT LONG TERM GOAL #1   Title Pt will be ind with advanced HEP and understand how to safely progress    Time 12    Period Weeks    Status New    Target Date 07/13/20      PT LONG TERM GOAL #2   Title Pt will report at least 50% reduction in pain  intensity and frequency with daily activities.    Time 12    Period Weeks    Status New    Target Date 07/13/20      PT LONG TERM GOAL #3   Title Pt will be educated on ways to support her spine and pelvis as her pregnancy progresses to protect her from pain and maximize her safety with mobility.    Time 12    Period Weeks    Status New    Target Date 07/13/20      PT LONG TERM GOAL #4   Title Pt will achieve Lt hip strength in rotators and in closed chain functional loading to at least 4+/5 for improved transfers and stairs    Time 12    Period Weeks    Status New    Target Date 07/13/20                 Plan - 04/29/20 1733    Clinical Impression Statement Pt arrived without pain but some stiffness in Lt SI joint.  She has fatigue related to discovery of low Vit D for which she will start taking supplements per her MD.  PT initiated HEP for LE and trunk stretching, encouraging ribcage expansion breathing during trunk stretches.  PT also initiated hip stabilization and strength for rotators.  Pt initially fatigued with Lt hip stabilization clam in supine but this improved with reps.  Good tolerance of all ther ex today.  More focus on manual techniques and hip stability progression next visit if ther ex going well.    Comorbidities open heart surgery when younger, considered high risk pregnancy due to this history    PT Frequency 1x / week    PT Duration 12 weeks    PT Next Visit Plan f/u on HEP and fatigue levels, STM bil trunk and hips, try sit to stand with hip abd band, monster walks, check Lt SLS/weight shifts    PT Home Exercise Plan Access Code: [email or web address]    Consulted and Agree with Plan of Care Patient           Patient will  benefit from skilled therapeutic intervention in order to improve the following deficits and impairments:     Visit Diagnosis: Chronic bilateral low back pain without sciatica  Muscle weakness (generalized)  Other lack of  coordination  Pain in left hip  Pain in right hip     Problem List Patient Active Problem List   Diagnosis Date Noted  . Palpitations 05/05/2014  . Residual ASD (atrial septal defect) following repair 05/05/2014    Morton Peters, PT 04/29/20 5:37 PM    [facility] [address], [address] [place], [place], [address] Phone: [phone]   Fax:  [phone]  Name: Kayla Everett MRN: [id number] Date of Birth: 1991-01-16

## 2020-05-04 ENCOUNTER — Ambulatory Visit (HOSPITAL_BASED_OUTPATIENT_CLINIC_OR_DEPARTMENT_OTHER): Payer: PRIVATE HEALTH INSURANCE

## 2020-05-04 ENCOUNTER — Ambulatory Visit: Payer: PRIVATE HEALTH INSURANCE | Attending: Obstetrics and Gynecology | Admitting: Obstetrics

## 2020-05-04 ENCOUNTER — Other Ambulatory Visit: Payer: Self-pay

## 2020-05-04 ENCOUNTER — Other Ambulatory Visit: Payer: Self-pay | Admitting: *Deleted

## 2020-05-04 ENCOUNTER — Ambulatory Visit: Payer: PRIVATE HEALTH INSURANCE | Admitting: *Deleted

## 2020-05-04 ENCOUNTER — Encounter: Payer: Self-pay | Admitting: *Deleted

## 2020-05-04 ENCOUNTER — Other Ambulatory Visit: Payer: Self-pay | Admitting: Obstetrics and Gynecology

## 2020-05-04 VITALS — BP 120/64 | HR 75

## 2020-05-04 DIAGNOSIS — O99412 Diseases of the circulatory system complicating pregnancy, second trimester: Secondary | ICD-10-CM | POA: Diagnosis not present

## 2020-05-04 DIAGNOSIS — Z3689 Encounter for other specified antenatal screening: Secondary | ICD-10-CM

## 2020-05-04 DIAGNOSIS — R002 Palpitations: Secondary | ICD-10-CM | POA: Insufficient documentation

## 2020-05-04 DIAGNOSIS — Z3A19 19 weeks gestation of pregnancy: Secondary | ICD-10-CM | POA: Diagnosis not present

## 2020-05-04 DIAGNOSIS — Z8774 Personal history of (corrected) congenital malformations of heart and circulatory system: Secondary | ICD-10-CM

## 2020-05-04 DIAGNOSIS — Z87798 Personal history of other (corrected) congenital malformations: Secondary | ICD-10-CM | POA: Diagnosis not present

## 2020-05-04 DIAGNOSIS — R000 Tachycardia, unspecified: Secondary | ICD-10-CM | POA: Insufficient documentation

## 2020-05-04 DIAGNOSIS — Z362 Encounter for other antenatal screening follow-up: Secondary | ICD-10-CM

## 2020-05-04 NOTE — Progress Notes (Signed)
MFM Consult Note  Kayla Everett was seen for a detailed fetal anatomy scan and consultation today due to a history of an ASD that was repaired with a pericardial patch at [facility] when she was 29 years old. She reports that since her ASD was repaired, she has experienced occasional palpitations and tachycardia.  She is not treated with any medications for palpitations or tachycardia.  She is otherwise asymptomatic and can perform daily functions and exercise without any problems.  She has been followed by a cardiologist and has had echocardiograms that have shown normal cardiac function without any abnormalities.  She reports that she saw her cardiologist Dr. Allyson Sabal earlier in her current pregnancy.  He does not believe that she will encounter any significant cardiac issues during her pregnancy.  She denies any other significant past medical or surgical history.  This is her first pregnancy. Her current medications include prenatal vitamins.    She had a cell free DNA test earlier in her pregnancy which indicated a low risk for trisomy 76, 39, and 13. A female fetus is predicted.   She was informed that the fetal growth and amniotic fluid level were appropriate for her gestational age.   There were no obvious fetal anomalies noted on today's ultrasound exam.  The patient was informed that anomalies may be missed due to technical limitations. If the fetus is in a suboptimal position or maternal habitus is increased, visualization of the fetus in the maternal uterus may be impaired.  During our consultation today the following issues were discussed:  Repaired ASD and pregnancy Ms. Hofferber was advised that as her ASD has been repaired and she has not experienced any long-term sequelae from the ASD, she most likely will have a normal pregnancy and delivery.    The difficulties of diagnosing an ASD in utero due to the physiologic patent foramen ovale in the fetus was discussed.  She was reassured that I  did not see any signs of a fetal cardiac defect on today's ultrasound.  She was referred to [facility] pediatric cardiology for a fetal echocardiogram.  Due to the physiologic changes associated with pregnancy and to prepare for her delivery, she should have a maternal echocardiogram performed with cardiology at between 34-36 weeks of her current pregnancy.  Please help the patient schedule this exam.    As she reports that she has to take antibiotics prior to procedures and as she is GBS positive, her cardiologist should be consulted to determine the appropriate prophylactic antibiotics (such as ampicillin and/or gentamicin) that should be administered should she have a vaginal or cesarean delivery.  Cardiology should be notified at the time of admission for delivery to help with the management of any complications should they arise during her labor and delivery.  A follow-up ultrasound exam was scheduled in our office at around 32 weeks for fetal assessment.  At the end of the consultation, the patient stated that all of her questions had been answered to her complete satisfaction.    Thank you for referring this patient for a Maternal-Fetal Medicine consultation.    Total time spent in consultation: 30 minutes.

## 2020-05-06 ENCOUNTER — Ambulatory Visit: Payer: PRIVATE HEALTH INSURANCE

## 2020-05-10 ENCOUNTER — Ambulatory Visit: Payer: PRIVATE HEALTH INSURANCE | Attending: Obstetrics and Gynecology

## 2020-05-10 ENCOUNTER — Other Ambulatory Visit: Payer: Self-pay

## 2020-05-10 DIAGNOSIS — M545 Low back pain, unspecified: Secondary | ICD-10-CM | POA: Diagnosis not present

## 2020-05-10 DIAGNOSIS — M25552 Pain in left hip: Secondary | ICD-10-CM | POA: Insufficient documentation

## 2020-05-10 DIAGNOSIS — G8929 Other chronic pain: Secondary | ICD-10-CM | POA: Diagnosis present

## 2020-05-10 DIAGNOSIS — M25551 Pain in right hip: Secondary | ICD-10-CM | POA: Insufficient documentation

## 2020-05-10 DIAGNOSIS — R278 Other lack of coordination: Secondary | ICD-10-CM | POA: Insufficient documentation

## 2020-05-10 DIAGNOSIS — M6281 Muscle weakness (generalized): Secondary | ICD-10-CM | POA: Insufficient documentation

## 2020-05-10 NOTE — Therapy (Signed)
[facility] [address], [address] [place], [place], [address] Phone: [phone]   Fax:  [phone]  Physical Therapy Treatment  Patient Details  Name: Kayla Everett MRN: [id number] Date of Birth: 07-25-1990 Referring Provider (PT): Nigel Bridgeman, [place]   Encounter Date: 05/10/2020   PT End of Session - 05/10/20 1016    Visit Number 3    Date for PT Re-Evaluation 07/13/20    Authorization Type Generic Commercial    Authorization - Visit Number 3    Authorization - Number of Visits 20    PT Start Time 0933    PT Stop Time 1014    PT Time Calculation (min) 41 min    Activity Tolerance Patient tolerated treatment well    Behavior During Therapy [facility] for tasks assessed/performed           Past Medical History:  Diagnosis Date  . Anxiety   . Depression   . Heart murmur   . Palpitations   . Residual ASD (atrial septal defect) following repair june 2012   [facility]    Past Surgical History:  Procedure Laterality Date  . ASD REPAIR      There were no vitals filed for this visit.   Subjective Assessment - 05/10/20 0934    Subjective My Lt SI feels like it is locking.  It started yesterday but I have had on and off discomfort for the past few days.  I am helping my parents move so I have been more active.    Patient Stated Goals manage symptoms    Currently in Pain? Yes    Pain Score 3     Pain Location Back    Pain Orientation Left    Pain Descriptors / Indicators Aching;Dull    Pain Type Chronic pain    Pain Onset More than a month ago    Pain Frequency Intermittent    Aggravating Factors  sit to stand transition, arching the bac    Pain Relieving Factors heat, bath                             [facility] Adult PT Treatment/Exercise - 05/10/20 0001      Lumbar Exercises: Stretches   Piriformis Stretch Left;2 reps;20 seconds      Knee/Hip Exercises: Stretches   Hip Flexor Stretch Left;3  reps;20 seconds      Manual Therapy   Manual Therapy Soft tissue mobilization;Joint mobilization    Manual therapy comments sideglide to lumbar spine grade 2-3- 30 second bouts x 3    Joint Mobilization long axis Lt Leg pull, Lt SI gapping    Soft tissue mobilization Lt gluteals and piriformis                    PT Short Term Goals - 04/20/20 1732      PT SHORT TERM GOAL #1   Title Pt will be ind with Rt LE flexibility aspect of HEP to reduce undue strain through Rt hemipelvis.    Time 3    Period Weeks    Status New    Target Date 05/11/20      PT SHORT TERM GOAL #2   Title Pt will demo improved Lt hemipelvic stability in closed chain SLS without Lt SI joint unlocking and eliminated Trendelenburg.    Time 4    Period Weeks    Status New  Target Date 05/18/20             PT Long Term Goals - 04/20/20 1734      PT LONG TERM GOAL #1   Title Pt will be ind with advanced HEP and understand how to safely progress    Time 12    Period Weeks    Status New    Target Date 07/13/20      PT LONG TERM GOAL #2   Title Pt will report at least 50% reduction in pain intensity and frequency with daily activities.    Time 12    Period Weeks    Status New    Target Date 07/13/20      PT LONG TERM GOAL #3   Title Pt will be educated on ways to support her spine and pelvis as her pregnancy progresses to protect her from pain and maximize her safety with mobility.    Time 12    Period Weeks    Status New    Target Date 07/13/20      PT LONG TERM GOAL #4   Title Pt will achieve Lt hip strength in rotators and in closed chain functional loading to at least 4+/5 for improved transfers and stairs    Time 12    Period Weeks    Status New    Target Date 07/13/20                 Plan - 05/10/20 1015    Clinical Impression Statement Pt reports Lt SI joint "locking up" over the weekend.  Pt has helped her parents move and aggravated her pain.  Pt with Lt hip flexor  tightness and tolerated stretching today.  PT provided passive hip flexor stretch, manual to Lt gluteals for mobilization, SI joint gapping and sideglide to lumbar paraspinals in sidelying.  Pt with good SI joint mobility and PT discussed trying to rest when Lt gluteals feel fatigued.  Pt has not been consistent with strength exercises and PT emphasized the importance to improve pelvic stability and endurance. Pt reports that has ordered a pregnancy belt and will use this for added stability when it arrives.  Pt will be on vacation and will return the week after for further treatment.    PT Frequency 1x / week    PT Duration 12 weeks    PT Treatment/Interventions ADLs/Self Care Home Management;Joint Manipulations;Spinal Manipulations;Taping;Dry needling;Passive range of motion;Manual techniques;Neuromuscular re-education;Therapeutic exercise;Therapeutic activities;Functional mobility training;Stair training;Biofeedback;Moist Heat;Cryotherapy    PT Next Visit Plan address pain, stability and mobility as needed.    PT Home Exercise Plan Access Code: [email or web address]    Recommended Other Services initial cert is signed    Consulted and Agree with Plan of Care Patient           Patient will benefit from skilled therapeutic intervention in order to improve the following deficits and impairments:  Decreased coordination, Decreased range of motion, Increased fascial restricitons, Pain, Increased muscle spasms, Decreased strength, Postural dysfunction, Impaired flexibility, Improper body mechanics  Visit Diagnosis: Chronic bilateral low back pain without sciatica  Muscle weakness (generalized)  Other lack of coordination  Pain in left hip  Pain in right hip     Problem List Patient Active Problem List   Diagnosis Date Noted  . Palpitations 05/05/2014  . Residual ASD (atrial septal defect) following repair 05/05/2014     Lorrene Reid, PT 05/10/20 10:17 AM  [facility] [address], [address] [place], [place], [address] Phone: [phone]   Fax:  [phone]  Name: Kayla Everett MRN: [id number] Date of Birth: May 04, 1991

## 2020-05-12 ENCOUNTER — Encounter: Payer: PRIVATE HEALTH INSURANCE | Admitting: Physical Therapy

## 2020-05-26 ENCOUNTER — Other Ambulatory Visit: Payer: Self-pay

## 2020-05-26 ENCOUNTER — Encounter: Payer: Self-pay | Admitting: Physical Therapy

## 2020-05-26 ENCOUNTER — Ambulatory Visit: Payer: PRIVATE HEALTH INSURANCE | Admitting: Physical Therapy

## 2020-05-26 DIAGNOSIS — M25552 Pain in left hip: Secondary | ICD-10-CM

## 2020-05-26 DIAGNOSIS — G8929 Other chronic pain: Secondary | ICD-10-CM

## 2020-05-26 DIAGNOSIS — M545 Low back pain, unspecified: Secondary | ICD-10-CM

## 2020-05-26 DIAGNOSIS — M25551 Pain in right hip: Secondary | ICD-10-CM

## 2020-05-26 DIAGNOSIS — R278 Other lack of coordination: Secondary | ICD-10-CM

## 2020-05-26 DIAGNOSIS — M6281 Muscle weakness (generalized): Secondary | ICD-10-CM

## 2020-05-26 NOTE — Therapy (Signed)
[facility] [address], [address] [place], [place], [address] Phone: [phone]   Fax:  [phone]  Physical Therapy Treatment  Patient Details  Name: Kayla Everett MRN: [id number] Date of Birth: Nov 26, 1990 Referring Provider (PT): Nigel Bridgeman, [place]   Encounter Date: 05/26/2020   PT End of Session - 05/26/20 1020    Visit Number 4    Number of Visits 20    Date for PT Re-Evaluation 07/13/20    Authorization Type Generic Commercial    Authorization Time Period through 07/02/20    Authorization - Visit Number 4    Authorization - Number of Visits 20    PT Start Time 0930    PT Stop Time 1015    PT Time Calculation (min) 45 min    Activity Tolerance Patient tolerated treatment well    Behavior During Therapy [facility] for tasks assessed/performed           Past Medical History:  Diagnosis Date  . Anxiety   . Depression   . Heart murmur   . Palpitations   . Residual ASD (atrial septal defect) following repair june 2012   [facility]    Past Surgical History:  Procedure Laterality Date  . ASD REPAIR      There were no vitals filed for this visit.   Subjective Assessment - 05/26/20 0935    Subjective I did great after last visit and while on vacation but now that I'm back I do have low grade intermittent pain with getting out of bed or putting weight through the leg as I get out of bed.    Pertinent History ASD repair, currently pregnant, scoliosis    How long can you sit comfortably? depends on the day    Patient Stated Goals manage symptoms    Currently in Pain? No/denies                             [facility] Adult PT Treatment/Exercise - 05/26/20 0001      Exercises   Exercises Lumbar;Knee/Hip      Lumbar Exercises: Stretches   Piriformis Stretch Left;Right;1 rep;20 seconds    Figure 4 Stretch 1 rep;20 seconds      Lumbar Exercises: Supine   Bridge 5 reps    Bridge with [place] [employer] 10 reps    Bridge with [employer] Limitations yoga block between knees    Basic Lumbar Stabilization Limitations feet on red ball, bil red tband pulldowns x 10    Other Supine Lumbar Exercises yellow loop band hip ER x 15 each      Knee/Hip Exercises: Standing   Forward Step Up Both;1 set;5 reps    Forward Step Up Limitations march to 3rd step      Knee/Hip Exercises: Sidelying   Hip ABduction Strengthening;Both;5 reps    Other Sidelying Knee/Hip Exercises march knee at 90 deg no hip rotation x 5 each for hip flexors      Knee/Hip Exercises: Prone   Other Prone Exercises counter plank in hip "L" with hip ext bil x 5      Manual Therapy   Soft tissue mobilization bil gluteals, piriformis and SI joint line                    PT Short Term Goals - 04/20/20 1732      PT SHORT TERM GOAL #1   Title Pt will  be ind with Rt LE flexibility aspect of HEP to reduce undue strain through Rt hemipelvis.    Time 3    Period Weeks    Status New    Target Date 05/11/20      PT SHORT TERM GOAL #2   Title Pt will demo improved Lt hemipelvic stability in closed chain SLS without Lt SI joint unlocking and eliminated Trendelenburg.    Time 4    Period Weeks    Status New    Target Date 05/18/20             PT Long Term Goals - 04/20/20 1734      PT LONG TERM GOAL #1   Title Pt will be ind with advanced HEP and understand how to safely progress    Time 12    Period Weeks    Status New    Target Date 07/13/20      PT LONG TERM GOAL #2   Title Pt will report at least 50% reduction in pain intensity and frequency with daily activities.    Time 12    Period Weeks    Status New    Target Date 07/13/20      PT LONG TERM GOAL #3   Title Pt will be educated on ways to support her spine and pelvis as her pregnancy progresses to protect her from pain and maximize her safety with mobility.    Time 12    Period Weeks    Status New    Target Date 07/13/20      PT LONG  TERM GOAL #4   Title Pt will achieve Lt hip strength in rotators and in closed chain functional loading to at least 4+/5 for improved transfers and stairs    Time 12    Period Weeks    Status New    Target Date 07/13/20                 Plan - 05/26/20 1021    Clinical Impression Statement Pt arrived painfree with rport of intermittent SI joint and hip pain on standing from bed.  Pelvic alignment and mobility was good.  She needed cueing for neutral hip positioning with sidelying march for hip flexor activation Rt>Lt today.  She demos good co-cueing of abdominals and hip stabilizers.  She continues to manage hip pain with stretches.  PT updated HEP for SL hip strength from today's session.  Continue along POC.    Comorbidities open heart surgery when younger, considered high risk pregnancy due to this history    PT Frequency 1x / week    PT Duration 12 weeks    PT Treatment/Interventions ADLs/Self Care Home Management;Joint Manipulations;Spinal Manipulations;Taping;Dry needling;Passive range of motion;Manual techniques;Neuromuscular re-education;Therapeutic exercise;Therapeutic activities;Functional mobility training;Stair training;Biofeedback;Moist Heat;Cryotherapy    PT Next Visit Plan give HEP handouts for SL hips, continue lumbopelvichip stab, STM, stretching    PT Home Exercise Plan Access Code: [email or web address]    Consulted and Agree with Plan of Care Patient           Patient will benefit from skilled therapeutic intervention in order to improve the following deficits and impairments:     Visit Diagnosis: Chronic bilateral low back pain without sciatica  Muscle weakness (generalized)  Other lack of coordination  Pain in left hip  Pain in right hip     Problem List Patient Active Problem List   Diagnosis Date Noted  . Palpitations 05/05/2014  . Residual ASD (atrial  septal defect) following repair 05/05/2014    Morton Peters, PT 05/26/20 10:24 AM   [facility] [address] [place], [place], [address] Phone: [phone]   Fax:  [phone]  Name: Kayla Everett MRN: [id number] Date of Birth: [birth year]

## 2020-06-02 ENCOUNTER — Other Ambulatory Visit: Payer: Self-pay

## 2020-06-02 ENCOUNTER — Ambulatory Visit: Payer: PRIVATE HEALTH INSURANCE | Attending: Obstetrics and Gynecology | Admitting: Physical Therapy

## 2020-06-02 ENCOUNTER — Encounter: Payer: Self-pay | Admitting: Physical Therapy

## 2020-06-02 DIAGNOSIS — M25552 Pain in left hip: Secondary | ICD-10-CM | POA: Insufficient documentation

## 2020-06-02 DIAGNOSIS — M545 Low back pain, unspecified: Secondary | ICD-10-CM | POA: Insufficient documentation

## 2020-06-02 DIAGNOSIS — M25551 Pain in right hip: Secondary | ICD-10-CM | POA: Insufficient documentation

## 2020-06-02 DIAGNOSIS — R278 Other lack of coordination: Secondary | ICD-10-CM | POA: Insufficient documentation

## 2020-06-02 DIAGNOSIS — M6281 Muscle weakness (generalized): Secondary | ICD-10-CM | POA: Diagnosis present

## 2020-06-02 DIAGNOSIS — G8929 Other chronic pain: Secondary | ICD-10-CM | POA: Insufficient documentation

## 2020-06-02 NOTE — Therapy (Signed)
[facility] [address], [address] [place], [place], [address] Phone: [phone]   Fax:  [phone]  Physical Therapy Treatment  Patient Details  Name: Kayla Everett MRN: [id number] Date of Birth: 04-10-1991 Referring Provider (PT): Nigel Bridgeman, [place]   Encounter Date: 06/02/2020   PT End of Session - 06/02/20 0933    Visit Number 5    Number of Visits 20    Date for PT Re-Evaluation 07/13/20    Authorization Type Generic Commercial    Authorization Time Period through 07/02/20    Authorization - Visit Number 5    Authorization - Number of Visits 20    PT Start Time 0934    PT Stop Time 1017    PT Time Calculation (min) 43 min    Activity Tolerance Patient tolerated treatment well    Behavior During Therapy [facility] for tasks assessed/performed           Past Medical History:  Diagnosis Date  . Anxiety   . Depression   . Heart murmur   . Palpitations   . Residual ASD (atrial septal defect) following repair june 2012   [facility]    Past Surgical History:  Procedure Laterality Date  . ASD REPAIR      There were no vitals filed for this visit.   Subjective Assessment - 06/02/20 0935    Subjective Having some trouble at night with some pain and stiff and sore first thing in the AM on rising.  Otherwise feeling good.    Pertinent History ASD repair, currently pregnant, scoliosis    How long can you sit comfortably? depends on the day    Patient Stated Goals manage symptoms    Currently in Pain? No/denies                             [facility] Adult PT Treatment/Exercise - 06/02/20 0001      Exercises   Exercises Shoulder      Lumbar Exercises: Stretches   Piriformis Stretch Left;Right;1 rep;30 seconds    Piriformis Stretch Limitations edge of mat table    Other Lumbar Stretch Exercise seated red ball rollouts middle, Rt, Lt 3x5 sec each way      Lumbar Exercises: Standing   Other  Standing Lumbar Exercises red band bil pullbacks/forwards small range with TA and PF contractions 5x5 sec each way      Lumbar Exercises: Sidelying   Hip Abduction Both;5 reps    Other Sidelying Lumbar Exercises hip flexion knee at 90 in neutral abd x 5 each      Lumbar Exercises: Quadruped   Opposite Arm/Leg Raise Right arm/Left leg;Left arm/Right leg;5 reps    Other Quadruped Lumbar Exercises TA and PF 3x10      Shoulder Exercises: Seated   Other Seated Exercises upper trap stretch bil 1x20 with OP      Shoulder Exercises: Standing   Other Standing Exercises doorway stretch 1x20       Manual Therapy   Manual Therapy Soft tissue mobilization    Soft tissue mobilization Rt thoracolumbar paraspinal broadening, myofascial release Rt ribcage                    PT Short Term Goals - 04/20/20 1732      PT SHORT TERM GOAL #1   Title Pt will be ind with Rt LE flexibility aspect of HEP to reduce undue  strain through Rt hemipelvis.    Time 3    Period Weeks    Status New    Target Date 05/11/20      PT SHORT TERM GOAL #2   Title Pt will demo improved Lt hemipelvic stability in closed chain SLS without Lt SI joint unlocking and eliminated Trendelenburg.    Time 4    Period Weeks    Status New    Target Date 05/18/20             PT Long Term Goals - 04/20/20 1734      PT LONG TERM GOAL #1   Title Pt will be ind with advanced HEP and understand how to safely progress    Time 12    Period Weeks    Status New    Target Date 07/13/20      PT LONG TERM GOAL #2   Title Pt will report at least 50% reduction in pain intensity and frequency with daily activities.    Time 12    Period Weeks    Status New    Target Date 07/13/20      PT LONG TERM GOAL #3   Title Pt will be educated on ways to support her spine and pelvis as her pregnancy progresses to protect her from pain and maximize her safety with mobility.    Time 12    Period Weeks    Status New    Target  Date 07/13/20      PT LONG TERM GOAL #4   Title Pt will achieve Lt hip strength in rotators and in closed chain functional loading to at least 4+/5 for improved transfers and stairs    Time 12    Period Weeks    Status New    Target Date 07/13/20                 Plan - 06/02/20 1020    Clinical Impression Statement Pt arrived with report of some pain at night and on waking in AM described as unsettled at night and stiff/sore in AM on waking. PT discussed pillow propping at night and some AM stretches in bed before rising.  Pt with increased soft tissue tension building in lumbar and thoracic paraspinals as pregnancy progresses, Rt>Lt.  PT cued breath control with PF/TA activation ther ex progression today, and ended session with manual techniques for STM on Rt paraspinals.  Progressed HEP.  Continue along POC.    Comorbidities open heart surgery when younger, considered high risk pregnancy due to this history    PT Frequency 1x / week    PT Duration 12 weeks    PT Treatment/Interventions ADLs/Self Care Home Management;Joint Manipulations;Spinal Manipulations;Taping;Dry needling;Passive range of motion;Manual techniques;Neuromuscular re-education;Therapeutic exercise;Therapeutic activities;Functional mobility training;Stair training;Biofeedback;Moist Heat;Cryotherapy    PT Next Visit Plan continue STM/myofascial release, breath coord with TA/PF, ribcage expansion, trunk and hip stretches    PT Home Exercise Plan Access Code: [email or web address]    Consulted and Agree with Plan of Care Patient           Patient will benefit from skilled therapeutic intervention in order to improve the following deficits and impairments:     Visit Diagnosis: Chronic bilateral low back pain without sciatica  Muscle weakness (generalized)  Other lack of coordination  Pain in left hip  Pain in right hip     Problem List Patient Active Problem List   Diagnosis Date Noted  . Palpitations 05/05/2014   .  Residual ASD (atrial septal defect) following repair 05/05/2014    Morton Peters, PT 06/02/20 10:25 AM   [facility] [address] [place], [place], [address] Phone: [phone]   Fax:  [phone]  Name: Kayla Everett MRN: [id number] Date of Birth: [birth year]

## 2020-06-07 ENCOUNTER — Other Ambulatory Visit: Payer: Self-pay

## 2020-06-07 ENCOUNTER — Telehealth: Payer: Self-pay

## 2020-06-07 NOTE — Telephone Encounter (Signed)
Pt calling to find out if her appointment can be virtual instead of coming into the office.  Pt will be here for a New pt/est care next week.  Please advise CB# [phone]

## 2020-06-07 NOTE — Telephone Encounter (Signed)
This has been discussed with provider and decision already made regarding appointment. Closing encounter.

## 2020-06-08 ENCOUNTER — Encounter: Payer: Self-pay | Admitting: Nurse Practitioner

## 2020-06-08 ENCOUNTER — Encounter: Payer: Self-pay | Admitting: Physical Therapy

## 2020-06-08 ENCOUNTER — Other Ambulatory Visit: Payer: Self-pay

## 2020-06-08 ENCOUNTER — Ambulatory Visit: Payer: PRIVATE HEALTH INSURANCE | Admitting: Physical Therapy

## 2020-06-08 ENCOUNTER — Telehealth (INDEPENDENT_AMBULATORY_CARE_PROVIDER_SITE_OTHER): Payer: PRIVATE HEALTH INSURANCE | Admitting: Nurse Practitioner

## 2020-06-08 VITALS — Ht 62.0 in | Wt 130.0 lb

## 2020-06-08 DIAGNOSIS — M545 Low back pain, unspecified: Secondary | ICD-10-CM | POA: Diagnosis not present

## 2020-06-08 DIAGNOSIS — M25552 Pain in left hip: Secondary | ICD-10-CM

## 2020-06-08 DIAGNOSIS — R0982 Postnasal drip: Secondary | ICD-10-CM | POA: Diagnosis not present

## 2020-06-08 DIAGNOSIS — F4322 Adjustment disorder with anxiety: Secondary | ICD-10-CM

## 2020-06-08 DIAGNOSIS — M6281 Muscle weakness (generalized): Secondary | ICD-10-CM

## 2020-06-08 DIAGNOSIS — M25551 Pain in right hip: Secondary | ICD-10-CM

## 2020-06-08 DIAGNOSIS — G8929 Other chronic pain: Secondary | ICD-10-CM

## 2020-06-08 DIAGNOSIS — R278 Other lack of coordination: Secondary | ICD-10-CM

## 2020-06-08 NOTE — Progress Notes (Signed)
Virtual Visit via Video Note  I connected with@ on 06/08/20 at 10:15 AM EST by a video enabled telemedicine application and verified that I am speaking with the correct person using two identifiers.  Location: Patient:Home Provider: Office Participants: patient and provider  I discussed the limitations of evaluation and management by telemedicine and the availability of in person appointments. I also discussed with the patient that there may be a patient responsible charge related to this service. The patient expressed understanding and agreed to proceed.  CC:Pt c/o sinus drainage year round but states it has been worse since pregnancy.  History of Present Illness: Kayla Everett is in her second trimester on pregnancy. This is her first pregnancy. She is under the care of an OB/GYN. She reports hx of allergic Rhinitis and Anxiety. These have gotten worse with current pregnancy. She reports post nasal drainage which make nausea worse. No purulent drainage, no headache, no fever, no cough, no loss of smell or taste. She also has difficulty establishing with a new psychologist. She moved from up [place] and unable to maintain appts with previous therapist. She was informed by [facility]-they were not taking any new patients. She is not interested in use of medication.  Observations/Objective: Alert and orientedx4 Normal speech  Assessment and Plan: Kayla Everett was seen today for acute visit.  Diagnoses and all orders for this visit:  PND (post-nasal drip)  Adjustment disorder with anxious mood   Follow Up Instructions: Ok to use saline nasal spray and claritin if post nasal drip is severe. Use cool mist humidifier at home. Try Psychology today.com for therapist within your network. Consider using a therapist within the [place] state licensure ([place]. [place], [place], and [place]).   I discussed the assessment and treatment plan with the patient. The patient was provided an opportunity to ask  questions and all were answered. The patient agreed with the plan and demonstrated an understanding of the instructions.   The patient was advised to call back or seek an in-person evaluation if the symptoms worsen or if the condition fails to improve as anticipated.  I provided 10 minutes of non-face-to-face time during this encounter.  Alysia Penna, NP

## 2020-06-08 NOTE — Therapy (Signed)
[facility] [address], [address] [place], [place], [address] Phone: [phone]   Fax:  [phone]  Physical Therapy Treatment  Patient Details  Name: Kayla Everett MRN: [id number] Date of Birth: May 31, 1991 Referring Provider (PT): Nigel Bridgeman, [place]   Encounter Date: 06/08/2020   PT End of Session - 06/08/20 1718    Visit Number 6    Number of Visits 20    Date for PT Re-Evaluation 07/13/20    Authorization Type Generic Commercial    Authorization Time Period through 07/02/20    Authorization - Visit Number 6    Authorization - Number of Visits 20    PT Start Time 1615    PT Stop Time 1700    PT Time Calculation (min) 45 min    Activity Tolerance Patient tolerated treatment well    Behavior During Therapy [facility] for tasks assessed/performed           Past Medical History:  Diagnosis Date  . Anxiety   . Depression   . Heart murmur   . Palpitations   . Residual ASD (atrial septal defect) following repair june 2012   [facility]    Past Surgical History:  Procedure Laterality Date  . ASD REPAIR      There were no vitals filed for this visit.   Subjective Assessment - 06/08/20 1618    Subjective I am so sore about the ribs and it is preventing me from sleeping well.  My SI joints are good - just a twinge when I get up in the AM or at night.    Pertinent History ASD repair, currently pregnant, scoliosis    Limitations Sitting    How long can you sit comfortably? depends on the day                             [facility] Adult PT Treatment/Exercise - 06/08/20 0001      Neuro Re-ed    Neuro Re-ed Details  sidelying TrA contractions with Pt self-palpation for feedback      Lumbar Exercises: Stretches   Other Lumbar Stretch Exercise seated blue ball rollouts 3x10 each      Lumbar Exercises: Quadruped   [place] Horse 10 reps    Other Quadruped Lumbar Exercises prayer neutral and with SB  3x10    Other Quadruped Lumbar Exercises quadruped sidebending      Manual Therapy   Manual Therapy Soft tissue mobilization;Myofascial release    Soft tissue mobilization Rt thoracic paraspinal broadening and stripping, obliques on Rt, Rt hip STM with Addaday instrument assist - d/c'd secondary to Pt feeling "itching" with this    Myofascial Release bil thoracodorsal fascial release in Lt SL                    PT Short Term Goals - 06/08/20 1721      PT SHORT TERM GOAL #1   Title Pt will be ind with Rt LE flexibility aspect of HEP to reduce undue strain through Rt hemipelvis.    Status Achieved      PT SHORT TERM GOAL #2   Title Pt will demo improved Lt hemipelvic stability in closed chain SLS without Lt SI joint unlocking and eliminated Trendelenburg.    Status Achieved             PT Long Term Goals - 06/08/20 1721      PT  LONG TERM GOAL #1   Title Pt will be ind with advanced HEP and understand how to safely progress    Status On-going      PT LONG TERM GOAL #2   Title Pt will report at least 50% reduction in pain intensity and frequency with daily activities.    Status Achieved      PT LONG TERM GOAL #3   Title Pt will be educated on ways to support her spine and pelvis as her pregnancy progresses to protect her from pain and maximize her safety with mobility.    Status On-going      PT LONG TERM GOAL #4   Title Pt will achieve Lt hip strength in rotators and in closed chain functional loading to at least 4+/5 for improved transfers and stairs    Status On-going                 Plan - 06/08/20 1718    Clinical Impression Statement Pt continues to have minimal pain in bil SI joints, reporting just a little twinge of pain upon standing first thing in AM.  She is getting very sore in bil ribcage which is disrupting sleep.  She has some burning along abdominal midline as her baby grows.  She is currently [redacted] weeks along.  Session focused on stretching,  manual techniques and instruction in SL TA contractions.  Pt with improved mobility of Rt thoracic and lumbar paraspinals and bil lumbar and ribcage fascia end of session.  PT encouraged Pt to continue TA contractions to support abdominal midline and abdomen as she grows in her pregnancy.  Continue along POC with ongoing assessment.    Comorbidities open heart surgery when younger, considered high risk pregnancy due to this history    Rehab Potential Excellent    PT Frequency 1x / week    PT Duration 12 weeks    PT Treatment/Interventions ADLs/Self Care Home Management;Joint Manipulations;Spinal Manipulations;Taping;Dry needling;Passive range of motion;Manual techniques;Neuromuscular re-education;Therapeutic exercise;Therapeutic activities;Functional mobility training;Stair training;Biofeedback;Moist Heat;Cryotherapy    PT Next Visit Plan continue STM/myofascial release, breath coord with TA/PF, ribcage expansion, trunk and hip stretches    PT Home Exercise Plan Access Code: [email or web address]    Consulted and Agree with Plan of Care Patient           Patient will benefit from skilled therapeutic intervention in order to improve the following deficits and impairments:     Visit Diagnosis: Chronic bilateral low back pain without sciatica  Muscle weakness (generalized)  Other lack of coordination  Pain in left hip  Pain in right hip     Problem List Patient Active Problem List   Diagnosis Date Noted  . Adjustment disorder with anxious mood 06/08/2020  . PND (post-nasal drip) 06/08/2020  . Palpitations 05/05/2014  . Residual ASD (atrial septal defect) following repair 05/05/2014    Morton Peters, PT 06/08/20 5:25 PM   [facility] [address], [address] [place], [place], [address] Phone: [phone]   Fax:  [phone]  Name: Kayla Everett MRN: [id number] Date of Birth: Feb 17, 1991

## 2020-06-08 NOTE — Patient Instructions (Signed)
Ok to use saline nasal spray and claritin if post nasal drip is severe. Use cool mist humidifier at home. Try Psychology today.com for therapist within your network. Consider using a therapist within the [place] state licensure ([place]. [place], [place], and [place]).  Living With Depression Everyone experiences occasional disappointment, sadness, and loss in their lives. When you are feeling down, blue, or sad for at least 2 weeks in a row, it may mean that you have depression. Depression can affect your thoughts and feelings, relationships, daily activities, and physical health. It is caused by changes in the way your brain functions. If you receive a diagnosis of depression, your health care provider will tell you which type of depression you have and what treatment options are available to you. If you are living with depression, there are ways to help you recover from it and also ways to prevent it from coming back. How to cope with lifestyle changes Coping with stress     Stress is your body's reaction to life changes and events, both good and bad. Stressful situations may include:  Getting married.  The death of a spouse.  Losing a job.  Retiring.  Having a baby. Stress can last just a few hours or it can be ongoing. Stress can play a major role in depression, so it is important to learn both how to cope with stress and how to think about it differently. Talk with your health care provider or a counselor if you would like to learn more about stress reduction. He or she may suggest some stress reduction techniques, such as:  Music therapy. This can include creating music or listening to music. Choose music that you enjoy and that inspires you.  Mindfulness-based meditation. This kind of meditation can be done while sitting or walking. It involves being aware of your normal breaths, rather than trying to control your breathing.  Centering prayer. This is a kind of meditation that involves focusing on a  spiritual word or phrase. Choose a word, phrase, or sacred image that is meaningful to you and that brings you peace.  Deep breathing. To do this, expand your stomach and inhale slowly through your nose. Hold your breath for 3-5 seconds, then exhale slowly, allowing your stomach muscles to relax.  Muscle relaxation. This involves intentionally tensing muscles then relaxing them. Choose a stress reduction technique that fits your lifestyle and personality. Stress reduction techniques take time and practice to develop. Set aside 5-15 minutes a day to do them. Therapists can offer training in these techniques. The training may be covered by some insurance plans. Other things you can do to manage stress include:  Keeping a stress diary. This can help you learn what triggers your stress and ways to control your response.  Understanding what your limits are and saying no to requests or events that lead to a schedule that is too full.  Thinking about how you respond to certain situations. You may not be able to control everything, but you can control how you react.  Adding humor to your life by watching funny films or TV shows.  Making time for activities that help you relax and not feeling guilty about spending your time this way.  Medicines Your health care provider may suggest certain medicines if he or she feels that they will help improve your condition. Avoid using alcohol and other substances that may prevent your medicines from working properly (may interact). It is also important to:  Talk with your  pharmacist or health care provider about all the medicines that you take, their possible side effects, and what medicines are safe to take together.  Make it your goal to take part in all treatment decisions (shared decision-making). This includes giving input on the side effects of medicines. It is best if shared decision-making with your health care provider is part of your total treatment  plan. If your health care provider prescribes a medicine, you may not notice the full benefits of it for 4-8 weeks. Most people who are treated for depression need to be on medicine for at least 6-12 months after they feel better. If you are taking medicines as part of your treatment, do not stop taking medicines without first talking to your health care provider. You may need to have the medicine slowly decreased (tapered) over time to decrease the risk of harmful side effects. Relationships Your health care provider may suggest family therapy along with individual therapy and drug therapy. While there may not be family problems that are causing you to feel depressed, it is still important to make sure your family learns as much as they can about your mental health. Having your family's support can help make your treatment successful. How to recognize changes in your condition Everyone has a different response to treatment for depression. Recovery from major depression happens when you have not had signs of major depression for two months. This may mean that you will start to:  Have more interest in doing activities.  Feel less hopeless than you did 2 months ago.  Have more energy.  Overeat less often, or have better or improving appetite.  Have better concentration. Your health care provider will work with you to decide the next steps in your recovery. It is also important to recognize when your condition is getting worse. Watch for these signs:  Having fatigue or low energy.  Eating too much or too little.  Sleeping too much or too little.  Feeling restless, agitated, or hopeless.  Having trouble concentrating or making decisions.  Having unexplained physical complaints.  Feeling irritable, angry, or aggressive. Get help as soon as you or your family members notice these symptoms coming back. How to get support and help from others How to talk with friends and family members about  your condition  Talking to friends and family members about your condition can provide you with one way to get support and guidance. Reach out to trusted friends or family members, explain your symptoms to them, and let them know that you are working with a health care provider to treat your depression. Financial resources Not all insurance plans cover mental health care, so it is important to check with your insurance carrier. If paying for co-pays or counseling services is a problem, search for a local or county mental health care center. They may be able to offer public mental health care services at low or no cost when you are not able to see a private health care provider. If you are taking medicine for depression, you may be able to get the generic form, which may be less expensive. Some makers of prescription medicines also offer help to patients who cannot afford the medicines they need. Follow these instructions at home:   Get the right amount and quality of sleep.  Cut down on using caffeine, tobacco, alcohol, and other potentially harmful substances.  Try to exercise, such as walking or lifting small weights.  Take over-the-counter and prescription medicines only  as told by your health care provider.  Eat a healthy diet that includes plenty of vegetables, fruits, whole grains, low-fat dairy products, and lean protein. Do not eat a lot of foods that are high in solid fats, added sugars, or salt.  Keep all follow-up visits as told by your health care provider. This is important. Contact a health care provider if:  You stop taking your antidepressant medicines, and you have any of these symptoms: ? Nausea. ? Headache. ? Feeling lightheaded. ? Chills and body aches. ? Not being able to sleep (insomnia).  You or your friends and family think your depression is getting worse. Get help right away if:  You have thoughts of hurting yourself or others. If you ever feel like you may  hurt yourself or others, or have thoughts about taking your own life, get help right away. You can go to your nearest emergency department or call:  Your local emergency services (911 in the [place]  A suicide crisis helpline, such as the National Suicide Prevention Lifeline at 437-498-8182. This is open 24-hours a day. Summary  If you are living with depression, there are ways to help you recover from it and also ways to prevent it from coming back.  Work with your health care team to create a management plan that includes counseling, stress management techniques, and healthy lifestyle habits. This information is not intended to replace advice given to you by your health care provider. Make sure you discuss any questions you have with your health care provider. Document Revised: 10/11/2018 Document Reviewed: 05/22/2016 Elsevier Patient Education  2020 [employer].

## 2020-07-03 NOTE — L&D Delivery Note (Signed)
Delivery Note   Patient Name: Kayla Everett DOB: [birth year] MRN: [id number]  Date of admission: 09/29/2020 Delivering MD: Dale Redmond  Date of delivery: 09/30/20 Type of delivery: SVD  Newborn Data: Live born female  Birth Weight:   APGAR: 8, 9  Newborn Delivery   Birth date/time: 09/30/2020 04:15:00 Delivery type: Vaginal, Spontaneous     Mauricio Po, 29 y.o., @ [redacted]w[redacted]d,  G1P0, who was admitted for presented for latent labor, intact, gbs+, received x1 dose pcn, strip became Cat 2 with late decels, position change, fluid bolus given,no resolution, R/B weight with AROM to resolve strip verses waiting for 2nd dose pcn for gbs, family verbalized desire to AROM, AROM, clear tolerated wlel, strip was then Cat 1 and reactive, then pt felt urge to push at station 1+ and complete, and prolonged decel noted to nadir of 70s, great maternal pushing effect noted, but I instructed RN to call Dr Mora Appl and faulty for vacuumed, pt pushed fetus out prior to anyone arrival.   She pushed for 5/min.  She delivered a viable infant, cephalic and restituted to the ROA position over an intact perineum.  A nuchal cord   was not identified. The baby was placed on maternal abdomen while initial step of NRP were perfmored (Dry, Stimulated, and warmed). Hat placed on baby for thermoregulation. Delayed cord clamping was performed for 2 minutes.  Cord double clamped and cut.  Cord cut by doula. Apgar scores were 8 and 9. Prophylactic Pitocin was started in the third stage of labor for active management. The placenta delivered spontaneously, shultz, with a 3 vessel cord and was sent to LD.  Inspection revealed 1st degree vaginal tear, pt opted for no repair, perineum intact, hemostatic. An examination of the vaginal vault and cervix was free from lacerations. The uterus was firm, bleeding stable.   Placenta and umbilical artery blood gas were not sent.  There were no complications during the procedure.  Mom and baby skin  to skin following delivery. Left in stable condition.Pt has h/o SVD repair , HR remained stable in 80s during labor and pushing.   Maternal Info: Anesthesia: Natural Episiotomy: No Lacerations:  1st degree no repaired Suture Repair: no Est. Blood Loss (mL):   Newborn Info:  Baby Sex: female Circumcision: N/A  APGAR (1 MIN): 8   APGAR (5 MINS): 9   APGAR (10 MINS):     Mom to postpartum.  Baby to Couplet care / Skin to Skin.   [place], [place], NP-C 09/30/20 4:35 AM

## 2020-07-09 ENCOUNTER — Ambulatory Visit: Payer: PRIVATE HEALTH INSURANCE | Attending: Obstetrics and Gynecology | Admitting: Physical Therapy

## 2020-07-09 ENCOUNTER — Encounter: Payer: Self-pay | Admitting: Physical Therapy

## 2020-07-09 ENCOUNTER — Other Ambulatory Visit: Payer: Self-pay

## 2020-07-09 DIAGNOSIS — M6281 Muscle weakness (generalized): Secondary | ICD-10-CM | POA: Insufficient documentation

## 2020-07-09 DIAGNOSIS — R278 Other lack of coordination: Secondary | ICD-10-CM | POA: Insufficient documentation

## 2020-07-09 DIAGNOSIS — M545 Low back pain, unspecified: Secondary | ICD-10-CM | POA: Diagnosis not present

## 2020-07-09 DIAGNOSIS — G8929 Other chronic pain: Secondary | ICD-10-CM | POA: Insufficient documentation

## 2020-07-09 DIAGNOSIS — M25551 Pain in right hip: Secondary | ICD-10-CM | POA: Insufficient documentation

## 2020-07-09 DIAGNOSIS — M25552 Pain in left hip: Secondary | ICD-10-CM | POA: Diagnosis present

## 2020-07-09 NOTE — Therapy (Signed)
[facility] [address], [place], [place], [address] Phone: [phone]   Fax:  [phone]  Physical Therapy Treatment  Patient Details  Name: Kayla Everett MRN: [id number] Date of Birth: 1990-12-15 Referring Provider (PT): Donnel Saxon, [place]   Encounter Date: 07/09/2020   PT End of Session - 07/09/20 1022    Visit Number 7    Number of Visits 20    Date for PT Re-Evaluation 07/13/20    Authorization Type Generic Commercial    Authorization Time Period through 07/02/20    Authorization - Visit Number 7    Authorization - Number of Visits 20    PT Start Time 1020    PT Stop Time 1058    PT Time Calculation (min) 38 min    Activity Tolerance Patient tolerated treatment well    Behavior During Therapy [facility] for tasks assessed/performed           Past Medical History:  Diagnosis Date  . Anxiety   . Depression   . Heart murmur   . Palpitations   . Residual ASD (atrial septal defect) following repair june 2012   [facility]    Past Surgical History:  Procedure Laterality Date  . ASD REPAIR      There were no vitals filed for this visit.   Subjective Assessment - 07/09/20 1020    Subjective I am sleeping better other than leg cramps in calves.  Not as much ribcage pain.  I feel my Lt SI sometimes but stretches help and so does pushing on the front of my left hip.    Pertinent History ASD repair, currently pregnant, scoliosis    Limitations Sitting    How long can you sit comfortably? depends on the day    Patient Stated Goals manage symptoms    Currently in Pain? No/denies                             [facility] Adult PT Treatment/Exercise - 07/09/20 0001      Lumbar Exercises: Stretches   Hip Flexor Stretch Left;Right;1 rep;30 seconds    Hip Flexor Stretch Limitations foot on second step    Gastroc Stretch 1 rep;Left;Right;30 seconds    Gastroc Stretch Limitations slant board     Other Lumbar Stretch Exercise seated blue ball rollouts 3x10 each      Lumbar Exercises: Seated   Other Seated Lumbar Exercises pelvic ROM seated on green ball circles, tailbone wags, tucks/lifts - x 5 each      Lumbar Exercises: Quadruped   Other Quadruped Lumbar Exercises hip circles x 5 each way with knees wide    Other Quadruped Lumbar Exercises frog stretch with prayer x 30 sec      Manual Therapy   Manual Therapy Soft tissue mobilization                    PT Short Term Goals - 06/08/20 1721      PT SHORT TERM GOAL #1   Title Pt will be ind with Rt LE flexibility aspect of HEP to reduce undue strain through Rt hemipelvis.    Status Achieved      PT SHORT TERM GOAL #2   Title Pt will demo improved Lt hemipelvic stability in closed chain SLS without Lt SI joint unlocking and eliminated Trendelenburg.    Status Achieved  PT Long Term Goals - 06/08/20 1721      PT LONG TERM GOAL #1   Title Pt will be ind with advanced HEP and understand how to safely progress    Status On-going      PT LONG TERM GOAL #2   Title Pt will report at least 50% reduction in pain intensity and frequency with daily activities.    Status Achieved      PT LONG TERM GOAL #3   Title Pt will be educated on ways to support her spine and pelvis as her pregnancy progresses to protect her from pain and maximize her safety with mobility.    Status On-going      PT LONG TERM GOAL #4   Title Pt will achieve Lt hip strength in rotators and in closed chain functional loading to at least 4+/5 for improved transfers and stairs    Status On-going                 Plan - 07/09/20 1103    Clinical Impression Statement Pt continues to manage bil SI pain well through ther ex and stretching.  She is now nearly [redacted] weeks along and has met with her doula to begin preparations for delivery.  She has had less ribcage pain and is sleeping better.  She has intermittent mild symptoms in Lt SI  joint with some relief with stretching and pressure release to glut min.  Session spent focusing on continued mobility and STM along Lt lateral hip.  Pt will continue to benefit from skilled PT along POC as she progresses through her third trimester.    Comorbidities open heart surgery when younger, considered high risk pregnancy due to this history    Rehab Potential Excellent    PT Frequency 1x / week    PT Duration 12 weeks    PT Treatment/Interventions ADLs/Self Care Home Management;Joint Manipulations;Spinal Manipulations;Taping;Dry needling;Passive range of motion;Manual techniques;Neuromuscular re-education;Therapeutic exercise;Therapeutic activities;Functional mobility training;Stair training;Biofeedback;Moist Heat;Cryotherapy    PT Next Visit Plan continue STM/myofascial release, breath coord with TA/PF, ribcage expansion, trunk and hip stretches    PT Home Exercise Plan Access Code: [email or web address]    Consulted and Agree with Plan of Care Patient           Patient will benefit from skilled therapeutic intervention in order to improve the following deficits and impairments:     Visit Diagnosis: Chronic bilateral low back pain without sciatica  Muscle weakness (generalized)  Other lack of coordination  Pain in left hip  Pain in right hip     Problem List Patient Active Problem List   Diagnosis Date Noted  . Adjustment disorder with anxious mood 06/08/2020  . PND (post-nasal drip) 06/08/2020  . Palpitations 05/05/2014  . Residual ASD (atrial septal defect) following repair 05/05/2014    Baruch Merl, PT 07/09/20 11:06 AM   [facility] [address] [place], [place], [address] Phone: [phone]   Fax:  [phone]  Name: SHOMARI MATUSIK MRN: [id number] Date of Birth: 1990/11/06

## 2020-07-13 ENCOUNTER — Ambulatory Visit: Payer: PRIVATE HEALTH INSURANCE | Admitting: Physical Therapy

## 2020-07-13 ENCOUNTER — Encounter: Payer: Self-pay | Admitting: Physical Therapy

## 2020-07-13 ENCOUNTER — Other Ambulatory Visit: Payer: Self-pay

## 2020-07-13 DIAGNOSIS — M6281 Muscle weakness (generalized): Secondary | ICD-10-CM

## 2020-07-13 DIAGNOSIS — R278 Other lack of coordination: Secondary | ICD-10-CM

## 2020-07-13 DIAGNOSIS — G8929 Other chronic pain: Secondary | ICD-10-CM

## 2020-07-13 DIAGNOSIS — M545 Low back pain, unspecified: Secondary | ICD-10-CM | POA: Diagnosis not present

## 2020-07-13 DIAGNOSIS — M25552 Pain in left hip: Secondary | ICD-10-CM

## 2020-07-13 DIAGNOSIS — M25551 Pain in right hip: Secondary | ICD-10-CM

## 2020-07-13 NOTE — Therapy (Signed)
[facility] [address], [address] [place], [place], [address] Phone: [phone]   Fax:  [phone]  Physical Therapy Treatment  Patient Details  Name: Kayla Everett MRN: [id number] Date of Birth: March 09, 1991 Referring Provider (PT): Nigel Bridgeman, [place]   Encounter Date: 07/13/2020   PT End of Session - 07/13/20 1614    Visit Number 8    Date for PT Re-Evaluation 10/05/20    Authorization Type Generic Commercial    Authorization - Visit Number 8    Authorization - Number of Visits 20    PT Start Time 1535    PT Stop Time 1614    PT Time Calculation (min) 39 min    Activity Tolerance Patient tolerated treatment well    Behavior During Therapy [facility] for tasks assessed/performed           Past Medical History:  Diagnosis Date  . Anxiety   . Depression   . Heart murmur   . Palpitations   . Residual ASD (atrial septal defect) following repair june 2012   [facility]    Past Surgical History:  Procedure Laterality Date  . ASD REPAIR      There were no vitals filed for this visit.   Subjective Assessment - 07/13/20 1534    Subjective I took a funny step with my dog and now feel it on my Rt side.  I also got oddly sore in both hips after last visit.  I haven't slept as well the last two nights due to hip pain.    Pertinent History ASD repair, currently pregnant, scoliosis    Limitations Sitting;Standing;Other (comment)   on rising from sleep or from chair   How long can you sit comfortably? depends on the day    Patient Stated Goals manage symptoms    Currently in Pain? 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, currently pregnant [redacted]w[redacted]d   Next MD Visit --   next week   Prior Therapy yes chiro and PF PT      Precautions   Precautions Other (comment)    Precaution Comments pregnant      Single Leg Stance   Comments neg Trendelenburg, much improved      Palpation   SI assessment  improved stability of form closure bil in standing march    Palpation comment bil hip tenderness, bil lumbar and thoracic paraspinals tight non-tender                         [facility] Adult PT Treatment/Exercise - 07/13/20 0001      Exercises   Exercises Shoulder      Lumbar Exercises: Stretches  Piriformis Stretch 1 rep;30 seconds;Left;Right    Figure 4 Stretch 1 rep;30 seconds;With overpressure      Lumbar Exercises: Quadruped   Other Quadruped Lumbar Exercises prayer and prayer with SB 2x10 each      Shoulder Exercises: Standing   Extension Strengthening;Both;15 reps;Theraband    Theraband Level (Shoulder Extension) Level 2 (Red)    Row Strengthening;Both;Left;Right;10 reps;Theraband    Theraband Level (Shoulder Row) Level 2 (Red)    Row Limitations both, Rt, Lt cycle x 10, then single arm with rotation alt x 10    Diagonals Strengthening;Both;10 reps;Theraband    Theraband Level (Shoulder Diagonals) Level 2 (Red)      Manual Therapy   Manual Therapy Myofascial release;Soft tissue mobilization    Soft tissue mobilization bil gluteals and piriformis, gentle    Myofascial Release bil thoracodorsal fascia                    PT Short Term Goals - 07/13/20 1537      PT SHORT TERM GOAL #1   Title Pt will be ind with Rt LE flexibility aspect of HEP to reduce undue strain through Rt hemipelvis.    Status Achieved      PT  SHORT TERM GOAL #2   Title Pt will demo improved Lt hemipelvic stability in closed chain SLS without Lt SI joint unlocking and eliminated Trendelenburg.    Status Achieved             PT Long Term Goals - 07/13/20 1538      PT LONG TERM GOAL #1   Title Pt will be ind with advanced HEP and understand how to safely progress    Status On-going      PT LONG TERM GOAL #2   Title Pt will report at least 50% reduction in pain intensity and frequency with daily activities.    Status Achieved      PT LONG TERM GOAL #3   Title Pt will be educated on ways to support her spine and pelvis as her pregnancy progresses to protect her from pain and maximize her safety with mobility.    Status On-going      PT LONG TERM GOAL #4   Title Pt will achieve Lt hip strength in rotators and in closed chain functional loading to at least 4+/5 for improved transfers and stairs    Status On-going      PT LONG TERM GOAL #5   Title Pt will be educated on self-stretching in prep for delivery and understand options for birthing positions.    Time 12    Period Weeks    Status New    Target Date 10/05/20                 Plan - 07/13/20 1540    Clinical Impression Statement Pt is [redacted]w[redacted]d along with her first pregnancy.  She has intermittent fluctuating Rt/Lt or bil SI joint and hip pain mostly on position transitions on rising from bed in AM or from a chair.  This is well managed at this time with heat, baths and stretching.  She has intermittent trouble sleeping secondary to bil hip pain.  She is demonstrating ongoing postural awareness and core strength as her pregnancy progresses.  She has some soft tissue tension building in thoracic and lumbar region which PT is addressing with myofascial techniques and STM.  PT introduced tband upper body ther ex today to prep her for holding, lifting and  carrying upon the birth of her baby in a few months.  Pt will continue to benefit from skilled PT 1x/week throughout  her pregnancy given her history of SI joint pain.  PT added a LTG of Pt education for birthing positions and self-stretching of vaginal introitus starting at 35 weeks.  Continue along POC.    Personal Factors and Comorbidities Comorbidity 1    Comorbidities open heart surgery when younger, considered high risk pregnancy due to this history    Examination-Activity Limitations Sit;Bend;Stairs    Examination-Participation Restrictions Driving;Community Activity    Stability/Clinical Decision Making Stable/Uncomplicated    Clinical Decision Making Low    Rehab Potential Excellent    PT Frequency 1x / week    PT Duration 12 weeks    PT Treatment/Interventions ADLs/Self Care Home Management;Joint Manipulations;Spinal Manipulations;Taping;Dry needling;Passive range of motion;Manual techniques;Neuromuscular re-education;Therapeutic exercise;Therapeutic activities;Functional mobility training;Stair training;Biofeedback;Moist Heat;Cryotherapy    PT Next Visit Plan continue STM/myofascial release, breath coord with TA/PF, ribcage expansion, trunk and hip stretches, at 35 weeks give info on self-stretching, discuss birthing positioning options    PT Home Exercise Plan Access Code: [email or web address]    Consulted and Agree with Plan of Care Patient           Patient will benefit from skilled therapeutic intervention in order to improve the following deficits and impairments:  Decreased coordination,Decreased range of motion,Increased fascial restricitons,Pain,Increased muscle spasms,Decreased strength,Postural dysfunction,Impaired flexibility,Improper body mechanics  Visit Diagnosis: Chronic bilateral low back pain without sciatica - Plan: PT plan of care cert/re-cert  Muscle weakness (generalized) - Plan: PT plan of care cert/re-cert  Other lack of coordination - Plan: PT plan of care cert/re-cert  Pain in left hip - Plan: PT plan of care cert/re-cert  Pain in right hip - Plan: PT plan of care  cert/re-cert     Problem List Patient Active Problem List   Diagnosis Date Noted  . Adjustment disorder with anxious mood 06/08/2020  . PND (post-nasal drip) 06/08/2020  . Palpitations 05/05/2014  . Residual ASD (atrial septal defect) following repair 05/05/2014    Morton Peters, PT 07/13/20 5:15 PM   [facility] [address], [address] [place], [place], [address] Phone: [phone]   Fax:  [phone]  Name: DONETTE MAINWARING MRN: [id number] Date of Birth: 1990/08/28

## 2020-07-15 ENCOUNTER — Telehealth: Payer: Self-pay | Admitting: Cardiovascular Disease

## 2020-07-15 DIAGNOSIS — Q211 Atrial septal defect, unspecified: Secondary | ICD-10-CM

## 2020-07-15 NOTE — Telephone Encounter (Signed)
Ordering an echo would be fine

## 2020-07-15 NOTE — Telephone Encounter (Signed)
Left a detailed message stating order for ECHO has been placed and a scheduler would contact pt to arrange date/time.

## 2020-07-15 NOTE — Telephone Encounter (Signed)
Patient states she is [redacted] weeks pregnant and states Dr. Allyson Sabal and her OBGYN were discussing her having an echo when she is 34-[redacted] weeks pregnant. She would like a call when the echo order is put in.

## 2020-07-20 ENCOUNTER — Encounter: Payer: PRIVATE HEALTH INSURANCE | Admitting: Physical Therapy

## 2020-07-27 ENCOUNTER — Ambulatory Visit: Payer: PRIVATE HEALTH INSURANCE

## 2020-08-03 ENCOUNTER — Other Ambulatory Visit: Payer: Self-pay

## 2020-08-03 ENCOUNTER — Ambulatory Visit: Payer: PRIVATE HEALTH INSURANCE | Attending: Obstetrics and Gynecology | Admitting: Physical Therapy

## 2020-08-03 ENCOUNTER — Encounter: Payer: Self-pay | Admitting: Physical Therapy

## 2020-08-03 DIAGNOSIS — M25552 Pain in left hip: Secondary | ICD-10-CM | POA: Diagnosis present

## 2020-08-03 DIAGNOSIS — R278 Other lack of coordination: Secondary | ICD-10-CM | POA: Diagnosis present

## 2020-08-03 DIAGNOSIS — M25551 Pain in right hip: Secondary | ICD-10-CM

## 2020-08-03 DIAGNOSIS — M6281 Muscle weakness (generalized): Secondary | ICD-10-CM

## 2020-08-03 DIAGNOSIS — G8929 Other chronic pain: Secondary | ICD-10-CM | POA: Diagnosis present

## 2020-08-03 DIAGNOSIS — M545 Low back pain, unspecified: Secondary | ICD-10-CM | POA: Diagnosis not present

## 2020-08-03 NOTE — Therapy (Signed)
[facility] [address], [address] [place], [place], [address] Phone: [phone]   Fax:  [phone]  Physical Therapy Treatment  Patient Details  Name: Kayla Everett MRN: [id number] Date of Birth: 1991-04-01 Referring Provider (PT): Nigel Bridgeman, [place]   Encounter Date: 08/03/2020   PT End of Session - 08/03/20 1538    Visit Number 9    Number of Visits 20    Date for PT Re-Evaluation 10/05/20    Authorization Type Generic Commercial    Authorization Time Period through 07/02/20    Authorization - Visit Number 9    Authorization - Number of Visits 20    PT Start Time 1533    PT Stop Time 1615    PT Time Calculation (min) 42 min    Activity Tolerance Patient tolerated treatment well    Behavior During Therapy [facility] for tasks assessed/performed           Past Medical History:  Diagnosis Date  . Anxiety   . Depression   . Heart murmur   . Palpitations   . Residual ASD (atrial septal defect) following repair june 2012   [facility]    Past Surgical History:  Procedure Laterality Date  . ASD REPAIR      There were no vitals filed for this visit.   Subjective Assessment - 08/03/20 1536    Subjective 32 weeks 6 days along with pregnancy.  Some Lt sided back and hip pain with sitting, transitions and at night when sleeping.    Pertinent History ASD repair, currently pregnant, scoliosis    Limitations Sitting;Standing;Other (comment)    How long can you sit comfortably? depends on the day    Patient Stated Goals manage symptoms    Currently in Pain? Yes    Pain Score 2     Pain Location Back    Pain Orientation Left    Pain Descriptors / Indicators Aching    Pain Type Chronic pain    Pain Radiating Towards Lt hip    Pain Onset More than a month ago    Pain Frequency Intermittent    Aggravating Factors  at night, sitting, transitions    Pain Relieving Factors stretching, PT, baths, heat                              [facility] Adult PT Treatment/Exercise - 08/03/20 0001      Self-Care   Self-Care Other Self-Care Comments    Other Self-Care Comments  self-stretching vaginal introitus, can start at 35 weeks with MD approval, handout given and demo/explanation using pelvic model      Knee/Hip Exercises: Stretches   Other Knee/Hip Stretches hip internal rotation 2x30 with overpressure supine    Other Knee/Hip Stretches hip windshield wipers and butterfly stretch x 30 sec      Manual Therapy   Manual Therapy Soft tissue mobilization;Myofascial release    Soft tissue mobilization Lt glut med/min, bil lumbar and thoracic paraspinals    Myofascial Release suction cup from L-spine to lateral trunk, bil                    PT Short Term Goals - 07/13/20 1537      PT SHORT TERM GOAL #1   Title Pt will be ind with Rt LE flexibility aspect of HEP to reduce undue strain through Rt hemipelvis.    Status Achieved  PT SHORT TERM GOAL #2   Title Pt will demo improved Lt hemipelvic stability in closed chain SLS without Lt SI joint unlocking and eliminated Trendelenburg.    Status Achieved             PT Long Term Goals - 08/03/20 1723      PT LONG TERM GOAL #5   Title Pt will be educated on self-stretching in prep for delivery and understand options for birthing positions.    Status Achieved                 Plan - 08/03/20 1718    Clinical Impression Statement Pt will be [redacted] weeks pregnant tomorrow.  She presents with fascial tightness from thoracolumbar junction to pelvis and around anterior abdomen.  She has had some difficulty sleeping, sitting and with transitions of positions due to Lt hip pain.  She had some Lt hip tightness and discomfort which seemed to be relieved with STM and stretching today.  PT initiated use of suction cup for myofascial release of posterior trunk with good response.  Pt felt improved release and relief of Lt hip with  addition of IR stretch of hip with light overpressure so encouraged her to incorporate into HEP.  PT gave demo, handout and explanation using pelvic model about introitus stretching to prep for delivery which she can begin at 35 weeks, with MD approval.  Continue along POC with ongoing asssessment.    Comorbidities open heart surgery when younger, considered high risk pregnancy due to this history    PT Frequency 1x / week    PT Duration 12 weeks    PT Treatment/Interventions ADLs/Self Care Home Management;Joint Manipulations;Spinal Manipulations;Taping;Dry needling;Passive range of motion;Manual techniques;Neuromuscular re-education;Therapeutic exercise;Therapeutic activities;Functional mobility training;Stair training;Biofeedback;Moist Heat;Cryotherapy    PT Next Visit Plan suction cup, f/u on self-stretching at 35 weeks, STM, LE stretching, review hip IR stretch supine with overpressure    PT Home Exercise Plan Access Code: [email or web address]    Consulted and Agree with Plan of Care Patient           Patient will benefit from skilled therapeutic intervention in order to improve the following deficits and impairments:     Visit Diagnosis: Chronic bilateral low back pain without sciatica  Muscle weakness (generalized)  Other lack of coordination  Pain in left hip  Pain in right hip     Problem List Patient Active Problem List   Diagnosis Date Noted  . Adjustment disorder with anxious mood 06/08/2020  . PND (post-nasal drip) 06/08/2020  . Palpitations 05/05/2014  . Residual ASD (atrial septal defect) following repair 05/05/2014    Kayla Everett 08/03/2020, 5:23 PM  [facility] [address] [place], [place], [address] Phone: [phone]   Fax:  [phone]  Name: Kayla Everett MRN: [id number] Date of Birth: 02-11-1991

## 2020-08-03 NOTE — Patient Instructions (Signed)
STRETCHING THE PELVIC FLOOR MUSCLES NO DILATOR  Supplies . Vaginal lubricant . Mirror (optional) . Gloves (optional) or clean hands Positioning . Start in a semi-reclined position with your head propped up. Bend your knees and place your thumb or finger at the vaginal opening. Procedure . Apply a moderate amount of lubricant on the outer skin of your vagina, the labia minora.  Apply additional lubricant to your finger. . Spread the skin away from the vaginal opening. Place the end of your finger at the opening. . Do a maximum contraction of the pelvic floor muscles. Tighten the vagina and the anus maximally and relax. . When you know they are relaxed, gently and slowly insert your finger into your vagina, directing your finger slightly downward, for 2-3 inches of insertion. . Relax and stretch the 6 o'clock position . Hold each stretch for _30-60 seconds, no pain more than 3/10 . Repeat the stretching in the 4 o'clock and 8 o'clock positions. . Next gently move your finger in a "U" shape  several times.  . You can also enter a second finger to work to spread the vaginal opening wider from 3:00-6:00 and 6:00-9:00 or 3:00-9:00 . Perform daily or every other day . Once you have accomplished the techniques you may try them in standing with one foot resting on the tub, or in other positions.  This is a good stretch to do in the shower if you don't need to use lubricant.  This can be done at 35 weeks or later in your pregnancy.   

## 2020-08-10 ENCOUNTER — Encounter: Payer: PRIVATE HEALTH INSURANCE | Admitting: Physical Therapy

## 2020-08-11 ENCOUNTER — Ambulatory Visit (HOSPITAL_COMMUNITY): Payer: PRIVATE HEALTH INSURANCE | Attending: Cardiovascular Disease

## 2020-08-11 ENCOUNTER — Other Ambulatory Visit: Payer: Self-pay

## 2020-08-11 DIAGNOSIS — Q211 Atrial septal defect, unspecified: Secondary | ICD-10-CM

## 2020-08-11 LAB — ECHOCARDIOGRAM COMPLETE
Area-P 1/2: 2.54 cm2
MV M vel: 4.39 m/s
MV Peak grad: 76.9 mmHg
S' Lateral: 3 cm

## 2020-08-17 ENCOUNTER — Encounter: Payer: PRIVATE HEALTH INSURANCE | Admitting: Physical Therapy

## 2020-08-18 ENCOUNTER — Other Ambulatory Visit: Payer: Self-pay

## 2020-08-18 ENCOUNTER — Encounter: Payer: Self-pay | Admitting: Physical Therapy

## 2020-08-18 ENCOUNTER — Ambulatory Visit: Payer: PRIVATE HEALTH INSURANCE | Admitting: Physical Therapy

## 2020-08-18 DIAGNOSIS — R278 Other lack of coordination: Secondary | ICD-10-CM

## 2020-08-18 DIAGNOSIS — M25551 Pain in right hip: Secondary | ICD-10-CM

## 2020-08-18 DIAGNOSIS — M545 Low back pain, unspecified: Secondary | ICD-10-CM | POA: Diagnosis not present

## 2020-08-18 DIAGNOSIS — M6281 Muscle weakness (generalized): Secondary | ICD-10-CM

## 2020-08-18 DIAGNOSIS — M25552 Pain in left hip: Secondary | ICD-10-CM

## 2020-08-18 DIAGNOSIS — G8929 Other chronic pain: Secondary | ICD-10-CM

## 2020-08-18 NOTE — Therapy (Signed)
[facility] [address], [address] [place], [place], [address] Phone: [phone]   Fax:  [phone]  Physical Therapy Treatment  Patient Details  Name: Kayla Everett MRN: [id number] Date of Birth: [birth year] Referring Provider (PT): Nigel Bridgeman, [place]   Encounter Date: 08/18/2020   PT End of Session - 08/18/20 1101    Visit Number 10    Number of Visits 20    Date for PT Re-Evaluation 10/05/20    Authorization Type Generic Commercial    Authorization Time Period through 07/02/20    Authorization - Visit Number 10    Authorization - Number of Visits 20    PT Start Time 1017    PT Stop Time 1058    PT Time Calculation (min) 41 min    Activity Tolerance Patient tolerated treatment well    Behavior During Therapy [facility] for tasks assessed/performed           Past Medical History:  Diagnosis Date  . Anxiety   . Depression   . Heart murmur   . Palpitations   . Residual ASD (atrial septal defect) following repair june 2012   [facility]    Past Surgical History:  Procedure Laterality Date  . ASD REPAIR      There were no vitals filed for this visit.   Subjective Assessment - 08/18/20 1020    Subjective Pt is 35 weeks. "I had pain relief x 1 week after last visit and then it came back and is on both sides equally.  I have pain at the end of the day into the low back."  Pt hiked over the weekend 3.5 miles and it was too much.    Pertinent History ASD repair, currently pregnant, scoliosis    How long can you sit comfortably? depends on the day    Patient Stated Goals manage symptoms    Currently in Pain? Yes    Pain Score 2     Pain Location Back    Pain Orientation Right;Left    Pain Descriptors / Indicators Aching    Pain Type Chronic pain    Pain Radiating Towards bil hips    Pain Onset More than a month ago    Pain Frequency Intermittent    Aggravating Factors  at night    Pain Relieving Factors  stretching, PT, baths, heat                             [facility] Adult PT Treatment/Exercise - 08/18/20 0001      Lumbar Exercises: Stretches   Lower Trunk Rotation 3 reps;10 seconds    Piriformis Stretch 1 rep;30 seconds;Left;Right    Figure 4 Stretch 1 rep;30 seconds;With overpressure    Other Lumbar Stretch Exercise hip IR/ER and IR stretch with OP x 30 sec      Manual Therapy   Manual Therapy Soft tissue mobilization    Soft tissue mobilization thoracic and lumbar paraspinals, gluteals, piriformis, TFL bil in SL                    PT Short Term Goals - 07/13/20 1537      PT SHORT TERM GOAL #1   Title Pt will be ind with Rt LE flexibility aspect of HEP to reduce undue strain through Rt hemipelvis.    Status Achieved      PT SHORT TERM GOAL #2   Title Pt  will demo improved Lt hemipelvic stability in closed chain SLS without Lt SI joint unlocking and eliminated Trendelenburg.    Status Achieved             PT Long Term Goals - 08/03/20 1723      PT LONG TERM GOAL #5   Title Pt will be educated on self-stretching in prep for delivery and understand options for birthing positions.    Status Achieved                 Plan - 08/18/20 1240    Clinical Impression Statement Pt is [redacted] weeks pregnant and got cleared to begin self-stretching of introitus to prep for upcoming delivery of her first child.  She is having increased pain Lt>Rt in lumbar, hip and buttock bil, more at night than during the day.  PT encouarged her to try SL on couch vs reclined sitting to see if this helps give her more relief before bed.  Pt was able to stretch Lt hip more deeply end of session following soft tissue work today.  She will be seen 1x/week for final 4 weeks before due date to help manage pain.    Comorbidities open heart surgery when younger, considered high risk pregnancy due to this history    PT Frequency 1x / week    PT Duration 12 weeks    PT  Treatment/Interventions ADLs/Self Care Home Management;Joint Manipulations;Spinal Manipulations;Taping;Dry needling;Passive range of motion;Manual techniques;Neuromuscular re-education;Therapeutic exercise;Therapeutic activities;Functional mobility training;Stair training;Biofeedback;Moist Heat;Cryotherapy    PT Next Visit Plan continue STM and stretching as patient nears due date    PT Home Exercise Plan Access Code: [email or web address]    Consulted and Agree with Plan of Care Patient           Patient will benefit from skilled therapeutic intervention in order to improve the following deficits and impairments:     Visit Diagnosis: Chronic bilateral low back pain without sciatica  Muscle weakness (generalized)  Other lack of coordination  Pain in left hip  Pain in right hip     Problem List Patient Active Problem List   Diagnosis Date Noted  . Adjustment disorder with anxious mood 06/08/2020  . PND (post-nasal drip) 06/08/2020  . Palpitations 05/05/2014  . Residual ASD (atrial septal defect) following repair 05/05/2014    Loistine Simas E Tammie Yanda 08/18/2020, 12:43 PM  [facility] [address], [address] [place], [place], [address] Phone: [phone]   Fax:  [phone]  Name: Kayla Everett MRN: [id number] Date of Birth: January 21, 1991

## 2020-08-25 ENCOUNTER — Encounter: Payer: Self-pay | Admitting: Physical Therapy

## 2020-08-25 ENCOUNTER — Ambulatory Visit: Payer: PRIVATE HEALTH INSURANCE | Admitting: Physical Therapy

## 2020-08-25 ENCOUNTER — Other Ambulatory Visit: Payer: Self-pay

## 2020-08-25 DIAGNOSIS — M545 Low back pain, unspecified: Secondary | ICD-10-CM

## 2020-08-25 DIAGNOSIS — M25551 Pain in right hip: Secondary | ICD-10-CM

## 2020-08-25 DIAGNOSIS — G8929 Other chronic pain: Secondary | ICD-10-CM

## 2020-08-25 DIAGNOSIS — M25552 Pain in left hip: Secondary | ICD-10-CM

## 2020-08-25 DIAGNOSIS — M6281 Muscle weakness (generalized): Secondary | ICD-10-CM

## 2020-08-25 NOTE — Therapy (Signed)
[facility] [address] [place], [place], [address] Phone: [phone]   Fax:  [phone]  Physical Therapy Treatment  Patient Details  Name: Kayla Everett MRN: [id number] Date of Birth: March 24, 1991 Referring Provider (PT): Nigel Bridgeman, [place]   Encounter Date: 08/25/2020   PT End of Session - 08/25/20 1106    Visit Number 11    Number of Visits 20    Date for PT Re-Evaluation 10/05/20    Authorization Type Generic Commercial    Authorization - Number of Visits 20    PT Start Time 1020    PT Stop Time 1058    PT Time Calculation (min) 38 min    Activity Tolerance Patient tolerated treatment well    Behavior During Therapy [facility] for tasks assessed/performed           Past Medical History:  Diagnosis Date  . Anxiety   . Depression   . Heart murmur   . Palpitations   . Residual ASD (atrial septal defect) following repair june 2012   [facility]    Past Surgical History:  Procedure Laterality Date  . ASD REPAIR      There were no vitals filed for this visit.   Subjective Assessment - 08/25/20 1021    Subjective Overall had a much better week since last visit.  Baby is dropping so I am feeling more pelvic pressure and pain.    Pertinent History ASD repair, currently pregnant, scoliosis    Limitations Sitting;Standing;Other (comment)    How long can you sit comfortably? depends on the day    Patient Stated Goals manage symptoms    Pain Score 0-No pain    Pain Location Pelvis    Pain Orientation Right;Left    Pain Descriptors / Indicators Pressure;Sharp    Pain Radiating Towards bil hips and pubic pain anteriorly    Pain Onset More than a month ago    Pain Frequency Intermittent                             [facility] Adult PT Treatment/Exercise - 08/25/20 0001      Self-Care   Self-Care Other Self-Care Comments    Other Self-Care Comments  verbal review of self-stretching  introitus for delivery prep      Manual Therapy   Manual Therapy Soft tissue mobilization;Myofascial release    Soft tissue mobilization thoracic and lumbar paraspinals, gluteals, piriformis, bil in SL    Myofascial Release suction cup: from central lumbar to lateral pelvis bil, lower abdominal quadrant bil along ilum bil                    PT Short Term Goals - 07/13/20 1537      PT SHORT TERM GOAL #1   Title Pt will be ind with Rt LE flexibility aspect of HEP to reduce undue strain through Rt hemipelvis.    Status Achieved      PT SHORT TERM GOAL #2   Title Pt will demo improved Lt hemipelvic stability in closed chain SLS without Lt SI joint unlocking and eliminated Trendelenburg.    Status Achieved             PT Long Term Goals - 08/03/20 1723      PT LONG TERM GOAL #5   Title Pt will be educated on self-stretching in prep for delivery and understand options for birthing positions.  Status Achieved                 Plan - 08/25/20 1107    Clinical Impression Statement Pt arrives with report of improved back and bil hip pain this past week.  She is currently [redacted] weeks pregnant and feels she has dropped and grown and has more anterior and lower pelvic pressure.  She wears pregnancy support brace when walking for exercise.  She continues to ride her horse.  She has some intermittent sharp pain in pubic joint and with position transitions after sitting or lying down for a long time.  PT performed STM and suction cup myofascial mobilization bil thoracolumbar and anterior lower abdominal quadrants.  PT reviewed SL TA contraction which Pt performs very well.  PT encouraged her to avoid Kegels at this time and continue stretching.  Verbal reinforcement and demo with pelvic model for introitus stretching for self or with husband was reviewed today.  Continue PT 1x/week until delivery.    Comorbidities open heart surgery when younger, considered high risk pregnancy due to  this history    PT Next Visit Plan continue STM and stretching as patient nears due date    PT Home Exercise Plan Access Code: [email or web address]    Consulted and Agree with Plan of Care Patient           Patient will benefit from skilled therapeutic intervention in order to improve the following deficits and impairments:     Visit Diagnosis: Chronic bilateral low back pain without sciatica  Muscle weakness (generalized)  Pain in right hip  Pain in left hip     Problem List Patient Active Problem List   Diagnosis Date Noted  . Adjustment disorder with anxious mood 06/08/2020  . PND (post-nasal drip) 06/08/2020  . Palpitations 05/05/2014  . Residual ASD (atrial septal defect) following repair 05/05/2014    Uthman Mroczkowski E Titianna Loomis 08/25/2020, 11:10 AM  [facility] [address] [place], [place], [address] Phone: [phone]   Fax:  [phone]  Name: ANZLEY DIBBERN MRN: [id number] Date of Birth: 09-21-1990

## 2020-08-31 ENCOUNTER — Other Ambulatory Visit: Payer: Self-pay

## 2020-08-31 ENCOUNTER — Ambulatory Visit: Payer: PRIVATE HEALTH INSURANCE | Attending: Obstetrics and Gynecology | Admitting: Physical Therapy

## 2020-08-31 ENCOUNTER — Encounter: Payer: Self-pay | Admitting: Physical Therapy

## 2020-08-31 DIAGNOSIS — G8929 Other chronic pain: Secondary | ICD-10-CM | POA: Insufficient documentation

## 2020-08-31 DIAGNOSIS — M25551 Pain in right hip: Secondary | ICD-10-CM | POA: Diagnosis present

## 2020-08-31 DIAGNOSIS — M6281 Muscle weakness (generalized): Secondary | ICD-10-CM | POA: Insufficient documentation

## 2020-08-31 DIAGNOSIS — M545 Low back pain, unspecified: Secondary | ICD-10-CM | POA: Insufficient documentation

## 2020-08-31 DIAGNOSIS — M25552 Pain in left hip: Secondary | ICD-10-CM | POA: Insufficient documentation

## 2020-08-31 NOTE — Therapy (Signed)
[facility] [address] [place], [place], [address] Phone: [phone]   Fax:  [phone]  Physical Therapy Treatment  Patient Details  Name: DAFINA SUK MRN: [id number] Date of Birth: [birth year] Referring Provider (PT): Nigel Bridgeman, [place]   Encounter Date: 08/31/2020   PT End of Session - 08/31/20 1609    Visit Number 12    Number of Visits 20    Date for PT Re-Evaluation 10/05/20    Authorization Type Generic Commercial    Authorization - Number of Visits 20    PT Start Time 1530    PT Stop Time 1608    PT Time Calculation (min) 38 min    Activity Tolerance Patient tolerated treatment well    Behavior During Therapy [facility] for tasks assessed/performed           Past Medical History:  Diagnosis Date  . Anxiety   . Depression   . Heart murmur   . Palpitations   . Residual ASD (atrial septal defect) following repair june 2012   [facility]    Past Surgical History:  Procedure Laterality Date  . ASD REPAIR      There were no vitals filed for this visit.   Subjective Assessment - 08/31/20 1531    Subjective I had the worst pain in Rt lower quadrant yesterday that I've had all along since my pregnancy.  Today is better.  I spoke to my OBGYN and got some ideas which helped.    Pertinent History ASD repair, currently pregnant, scoliosis    Limitations Sitting;Standing;Other (comment)    How long can you sit comfortably? depends on the day    Patient Stated Goals manage symptoms    Currently in Pain? Yes    Pain Score 2     Pain Location Pelvis    Pain Orientation Anterior;Posterior;Left;Right    Pain Descriptors / Indicators Sore;Sharp;Aching    Pain Type Chronic pain    Pain Onset More than a month ago                             [facility] Adult PT Treatment/Exercise - 08/31/20 0001      Lumbar Exercises: Stretches   Other Lumbar Stretch Exercise hip flexion stretching 5x5"  holds foot on 2nd step each Rt/Lt    Other Lumbar Stretch Exercise adductor warrior stretch 1x20 sec each      Lumbar Exercises: Standing   Other Standing Lumbar Exercises straddle stance forearms on counter baby drop decompression for back x 1' with lateral lunge sway for adductor stretch      Lumbar Exercises: Seated   Other Seated Lumbar Exercises pelvic ROM seated on 55cm ball: tucks/lifts, circles, tail wags x 10 each    Other Seated Lumbar Exercises tall sitting thoracic rotation with light overpressure 2x10"      Manual Therapy   Manual Therapy Soft tissue mobilization;Myofascial release    Soft tissue mobilization bil thoracic and lumbar paraspinals    Myofascial Release medial to lateral myofascial release lumbar to lateral trunk bil                    PT Short Term Goals - 07/13/20 1537      PT SHORT TERM GOAL #1   Title Pt will be ind with Rt LE flexibility aspect of HEP to reduce undue strain through Rt hemipelvis.    Status Achieved  PT SHORT TERM GOAL #2   Title Pt will demo improved Lt hemipelvic stability in closed chain SLS without Lt SI joint unlocking and eliminated Trendelenburg.    Status Achieved             PT Long Term Goals - 08/03/20 1723      PT LONG TERM GOAL #5   Title Pt will be educated on self-stretching in prep for delivery and understand options for birthing positions.    Status Achieved                 Plan - 08/31/20 1611    Clinical Impression Statement Pt is [redacted]w[redacted]d pregnant and had a spike of Rt lower anterior pelvic pain yesterday that varied from past experience of pain.  Today is better and Pt contacted OB and was given some exercises and positions to help.  She has occassional twinges of pain in Lt SI joint and pubic joint with position transitions but they don't last.  She continues to stretch and spends time seated on physioball for pelvic relief.  PT continues to target stretching and STM/myofascial release to  facilitate soft tissue mobility in late-stage pregnancy.  Pt and her husband are ind with introitus stretching at home.  Continue along POC with ongoing assessment x 2 more visits before delivery.    Comorbidities open heart surgery when younger, considered high risk pregnancy due to this history    PT Frequency 1x / week    PT Duration 12 weeks    PT Next Visit Plan continue STM and stretching as patient nears due date    PT Home Exercise Plan Access Code: [email or web address]    Consulted and Agree with Plan of Care Patient           Patient will benefit from skilled therapeutic intervention in order to improve the following deficits and impairments:     Visit Diagnosis: Chronic bilateral low back pain without sciatica  Pain in right hip  Pain in left hip     Problem List Patient Active Problem List   Diagnosis Date Noted  . Adjustment disorder with anxious mood 06/08/2020  . PND (post-nasal drip) 06/08/2020  . Palpitations 05/05/2014  . Residual ASD (atrial septal defect) following repair 05/05/2014    Loistine Simas E Lynkin Saini 08/31/2020, 4:14 PM  [facility] [address], [address] [place], [place], [address] Phone: [phone]   Fax:  [phone]  Name: KERIANA SARSFIELD MRN: [id number] Date of Birth: 1990-08-19

## 2020-09-07 ENCOUNTER — Ambulatory Visit: Payer: PRIVATE HEALTH INSURANCE | Admitting: Physical Therapy

## 2020-09-07 ENCOUNTER — Encounter: Payer: Self-pay | Admitting: Physical Therapy

## 2020-09-07 ENCOUNTER — Other Ambulatory Visit: Payer: Self-pay

## 2020-09-07 DIAGNOSIS — M545 Low back pain, unspecified: Secondary | ICD-10-CM | POA: Diagnosis not present

## 2020-09-07 DIAGNOSIS — G8929 Other chronic pain: Secondary | ICD-10-CM

## 2020-09-07 DIAGNOSIS — M6281 Muscle weakness (generalized): Secondary | ICD-10-CM

## 2020-09-07 DIAGNOSIS — M25551 Pain in right hip: Secondary | ICD-10-CM

## 2020-09-07 DIAGNOSIS — M25552 Pain in left hip: Secondary | ICD-10-CM

## 2020-09-07 NOTE — Therapy (Signed)
[facility] [address] [place], [place], [address] Phone: [phone]   Fax:  [phone]  Physical Therapy Treatment  Patient Details  Name: Kayla Everett MRN: [id number] Date of Birth: 1990/07/07 Referring Provider (PT): Nigel Bridgeman, [place]   Encounter Date: 09/07/2020   PT End of Session - 09/07/20 1538    Visit Number 13    Number of Visits 20    Date for PT Re-Evaluation 10/05/20    Authorization Type Generic Commercial    Authorization Time Period through 07/02/20    Authorization - Number of Visits 20    PT Start Time 1535    PT Stop Time 1615    PT Time Calculation (min) 40 min    Activity Tolerance Patient tolerated treatment well    Behavior During Therapy [facility] for tasks assessed/performed           Past Medical History:  Diagnosis Date  . Anxiety   . Depression   . Heart murmur   . Palpitations   . Residual ASD (atrial septal defect) following repair june 2012   [facility]    Past Surgical History:  Procedure Laterality Date  . ASD REPAIR      There were no vitals filed for this visit.   Subjective Assessment - 09/07/20 1534    Subjective I was quite sore and Rt anterior hip was triggered after last visit.  It  has since calmed down.  Pressure feels good to outer/back of hips    Pertinent History ASD repair, currently pregnant, scoliosis    How long can you sit comfortably? depends on the day    Patient Stated Goals manage symptoms    Currently in Pain? No/denies    Pain Score 0-No pain    Pain Onset More than a month ago                             [facility] Adult PT Treatment/Exercise - 09/07/20 0001      Manual Therapy   Manual Therapy Soft tissue mobilization;Myofascial release    Soft tissue mobilization strumming and elongation bil lumbar paraspinals, gentle stripping bil glut med and piriformis    Myofascial Release suction cup: from central lumbar to  lateral pelvis bil                    PT Short Term Goals - 07/13/20 1537      PT SHORT TERM GOAL #1   Title Pt will be ind with Rt LE flexibility aspect of HEP to reduce undue strain through Rt hemipelvis.    Status Achieved      PT SHORT TERM GOAL #2   Title Pt will demo improved Lt hemipelvic stability in closed chain SLS without Lt SI joint unlocking and eliminated Trendelenburg.    Status Achieved             PT Long Term Goals - 08/03/20 1723      PT LONG TERM GOAL #5   Title Pt will be educated on self-stretching in prep for delivery and understand options for birthing positions.    Status Achieved                 Plan - 09/07/20 1615    Clinical Impression Statement Pt reported signif soreness after last session which she felt was a combination of stretching and positioning on treatment table.  Session  focused on gentle manual techniques in sidelying with careful positioning using pillow to support abdomen and between knees.  PT monitored for comfort throughout session.  Suction cup and gentle massage techniques used for lumbar fascia, paraspinals and lateral/posterior hips bil in SL.  Pt with one more appointment scheduled before upcoming due date on 3/23.    Comorbidities open heart surgery when younger, considered high risk pregnancy due to this history    PT Frequency 1x / week    PT Duration 12 weeks    PT Treatment/Interventions ADLs/Self Care Home Management;Joint Manipulations;Spinal Manipulations;Taping;Dry needling;Passive range of motion;Manual techniques;Neuromuscular re-education;Therapeutic exercise;Therapeutic activities;Functional mobility training;Stair training;Biofeedback;Moist Heat;Cryotherapy    PT Next Visit Plan continue STM and myofascial release with care taken for comfortable positioning    PT Home Exercise Plan Access Code: [email or web address]    Consulted and Agree with Plan of Care Patient           Patient will benefit from skilled  therapeutic intervention in order to improve the following deficits and impairments:     Visit Diagnosis: Chronic bilateral low back pain without sciatica  Pain in right hip  Pain in left hip  Muscle weakness (generalized)     Problem List Patient Active Problem List   Diagnosis Date Noted  . Adjustment disorder with anxious mood 06/08/2020  . PND (post-nasal drip) 06/08/2020  . Palpitations 05/05/2014  . Residual ASD (atrial septal defect) following repair 05/05/2014    Loistine Simas E Pharaoh Pio 09/07/2020, 5:01 PM  [facility] [address] [place], [place], [address] Phone: [phone]   Fax:  [phone]  Name: Kayla Everett MRN: [id number] Date of Birth: 1991-05-03

## 2020-09-14 ENCOUNTER — Ambulatory Visit: Payer: PRIVATE HEALTH INSURANCE | Admitting: Physical Therapy

## 2020-09-14 ENCOUNTER — Other Ambulatory Visit: Payer: Self-pay

## 2020-09-14 ENCOUNTER — Encounter: Payer: Self-pay | Admitting: Physical Therapy

## 2020-09-14 DIAGNOSIS — M25551 Pain in right hip: Secondary | ICD-10-CM

## 2020-09-14 DIAGNOSIS — M25552 Pain in left hip: Secondary | ICD-10-CM

## 2020-09-14 DIAGNOSIS — G8929 Other chronic pain: Secondary | ICD-10-CM

## 2020-09-14 DIAGNOSIS — M545 Low back pain, unspecified: Secondary | ICD-10-CM | POA: Diagnosis not present

## 2020-09-14 NOTE — Therapy (Signed)
[facility] [address], [place], [place], [address] Phone: [phone]   Fax:  [phone]  Physical Therapy Treatment  Patient Details  Name: Kayla Everett MRN: [id number] Date of Birth: [birth year] Referring Provider (PT): Donnel Saxon, [place]   Encounter Date: 09/14/2020   PT End of Session - 09/14/20 1707    Visit Number 14    Number of Visits 20    Date for PT Re-Evaluation 10/05/20    Authorization Type Generic Commercial    Authorization Time Period through 07/02/20    Authorization - Visit Number 14    Authorization - Number of Visits 20    PT Start Time 7793    PT Stop Time 1615    PT Time Calculation (min) 40 min    Activity Tolerance Patient tolerated treatment well    Behavior During Therapy [facility] for tasks assessed/performed           Past Medical History:  Diagnosis Date  . Anxiety   . Depression   . Heart murmur   . Palpitations   . Residual ASD (atrial septal defect) following repair june 2012   [facility]    Past Surgical History:  Procedure Laterality Date  . ASD REPAIR      There were no vitals filed for this visit.   Subjective Assessment - 09/14/20 1707    Subjective Generally feeling really good.  Using the suction cup in the shower to keep my back loose.    Pertinent History ASD repair, currently pregnant, scoliosis    Limitations Sitting;Standing;Other (comment)    How long can you sit comfortably? depends on the day    Patient Stated Goals manage symptoms    Currently in Pain? 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, currently pregnant [redacted]w[redacted]d  Next MD Visit --   next week   Prior Therapy yes chiro and PF PT                          [facility] - [facility]      Manual Therapy   Manual Therapy Soft tissue mobilization;Myofascial release    Soft tissue mobilization broadening Rt QL and thoracic paraspinals, stripping thoracic paraspinals, broadening bil gluteals and piriformis    Myofascial Release suction cup: from central lumbar to lateral pelvis bil                    PT Short Term Goals - 07/13/20 1537      PT SHORT TERM GOAL #1   Title Pt will be ind with Rt LE flexibility aspect of HEP to reduce undue strain through Rt hemipelvis.    Status Achieved      PT SHORT TERM GOAL #2   Title Pt will demo improved Lt hemipelvic stability in closed chain SLS without Lt SI joint unlocking and eliminated Trendelenburg.    Status Achieved             PT Long Term Goals - 08/03/20 1723      PT LONG TERM GOAL #5   Title Pt will be educated on  self-stretching in prep for delivery and understand options for birthing positions.    Status Achieved                 Plan - 09/14/20 1708    Clinical Impression Statement Pt is 52w6dpregnant and arrives for last scheduled appointment before delivery without pain.  She reports some pelvic and rectal pressure but less Rt anterior lower quadrant pain other than when baby moves.  She had increased Rt sided tension along QL and thoracic paraspinals which improved with STM today.  Pt has been working on introitus stretching with her husband and continues to stretch.  She plans to seek pelvic PT after delivery.  D/C for now with upcoming delivery.    Comorbidities open heart surgery when younger, considered high risk pregnancy due to this history    PT Frequency 1x / week    PT Duration 12 weeks    PT Treatment/Interventions ADLs/Self Care Home Management;Joint Manipulations;Spinal Manipulations;Taping;Dry needling;Passive range of motion;Manual techniques;Neuromuscular re-education;Therapeutic  exercise;Therapeutic activities;Functional mobility training;Stair training;Biofeedback;Moist Heat;Cryotherapy    PT Next Visit Plan d/c, upcoming delivery of first child    PT Home Exercise Plan Access Code: [email or web address]   Consulted and Agree with Plan of Care Patient           Patient will benefit from skilled therapeutic intervention in order to improve the following deficits and impairments:     Visit Diagnosis: Chronic bilateral low back pain without sciatica  Pain in right hip  Pain in left hip     Problem List Patient Active Problem List   Diagnosis Date Noted  . Adjustment disorder with anxious mood 06/08/2020  . PND (post-nasal drip) 06/08/2020  . Palpitations 05/05/2014  . Residual ASD (atrial septal defect) following repair 05/05/2014    PHYSICAL THERAPY DISCHARGE SUMMARY  Visits from Start of Care: 14  Current functional level related to goals / functional outcomes: Pt getting ready to deliver first child.  D/C with recommended pelvic PT evaluation after clearance following delivery.   Remaining deficits: See above   Education / Equipment: HEP Plan: Patient agrees to discharge.  Patient goals were met. Patient is being discharged due to a change in medical status.  ?????         JBaruch Merl PT 09/14/20 5:16 PM   [facility] [address] [place] [address]Phone: [phone]  Fax:  [phone] Name: Kayla MCNEILMRN: [id number] of Birth: [birth year]

## 2020-09-21 ENCOUNTER — Other Ambulatory Visit: Payer: Self-pay | Admitting: Obstetrics and Gynecology

## 2020-09-22 ENCOUNTER — Telehealth (HOSPITAL_COMMUNITY): Payer: Self-pay | Admitting: *Deleted

## 2020-09-22 ENCOUNTER — Encounter (HOSPITAL_COMMUNITY): Payer: Self-pay | Admitting: *Deleted

## 2020-09-22 NOTE — Telephone Encounter (Signed)
Preadmission screen  

## 2020-09-27 ENCOUNTER — Other Ambulatory Visit (HOSPITAL_COMMUNITY): Payer: PRIVATE HEALTH INSURANCE | Attending: Obstetrics & Gynecology

## 2020-09-29 ENCOUNTER — Inpatient Hospital Stay (HOSPITAL_COMMUNITY): Payer: PRIVATE HEALTH INSURANCE

## 2020-09-29 ENCOUNTER — Other Ambulatory Visit: Payer: Self-pay

## 2020-09-29 ENCOUNTER — Encounter (HOSPITAL_COMMUNITY): Payer: Self-pay | Admitting: Obstetrics & Gynecology

## 2020-09-29 ENCOUNTER — Inpatient Hospital Stay (HOSPITAL_COMMUNITY)
Admission: AD | Admit: 2020-09-29 | Discharge: 2020-10-02 | DRG: 807 | Disposition: A | Discharge status: Home / Self Care | Payer: PRIVATE HEALTH INSURANCE | Attending: Obstetrics & Gynecology | Admitting: Obstetrics & Gynecology

## 2020-09-29 DIAGNOSIS — O365930 Maternal care for other known or suspected poor fetal growth, third trimester, not applicable or unspecified: Secondary | ICD-10-CM | POA: Diagnosis present

## 2020-09-29 DIAGNOSIS — Z20822 Contact with and (suspected) exposure to covid-19: Secondary | ICD-10-CM | POA: Diagnosis present

## 2020-09-29 DIAGNOSIS — Z8774 Personal history of (corrected) congenital malformations of heart and circulatory system: Secondary | ICD-10-CM

## 2020-09-29 DIAGNOSIS — O99824 Streptococcus B carrier state complicating childbirth: Secondary | ICD-10-CM | POA: Diagnosis present

## 2020-09-29 DIAGNOSIS — O480 Post-term pregnancy: Principal | ICD-10-CM | POA: Diagnosis present

## 2020-09-29 DIAGNOSIS — Z3A41 41 weeks gestation of pregnancy: Secondary | ICD-10-CM

## 2020-09-29 NOTE — MAU Note (Signed)
Ctxs since 0330. Denies LOF or VB. Closed on Monday

## 2020-09-29 NOTE — Progress Notes (Signed)
[facility] CNM notified of pt's admission and status. Aware of ctx pattern, sve, +GBS. Pt is unsure if she wants epidural and does not want augmentation at this time. Will watch pt and hour and reck cervix. Pt agrees with plan of care

## 2020-09-30 ENCOUNTER — Encounter (HOSPITAL_COMMUNITY): Payer: Self-pay | Admitting: Obstetrics & Gynecology

## 2020-09-30 DIAGNOSIS — Z8774 Personal history of (corrected) congenital malformations of heart and circulatory system: Secondary | ICD-10-CM | POA: Diagnosis not present

## 2020-09-30 DIAGNOSIS — O365930 Maternal care for other known or suspected poor fetal growth, third trimester, not applicable or unspecified: Secondary | ICD-10-CM | POA: Diagnosis present

## 2020-09-30 DIAGNOSIS — Z3A41 41 weeks gestation of pregnancy: Secondary | ICD-10-CM | POA: Diagnosis not present

## 2020-09-30 DIAGNOSIS — O480 Post-term pregnancy: Secondary | ICD-10-CM | POA: Diagnosis present

## 2020-09-30 DIAGNOSIS — O99824 Streptococcus B carrier state complicating childbirth: Secondary | ICD-10-CM | POA: Diagnosis present

## 2020-09-30 DIAGNOSIS — Z20822 Contact with and (suspected) exposure to covid-19: Secondary | ICD-10-CM | POA: Diagnosis present

## 2020-09-30 LAB — CBC
HCT: 44.5 % (ref 36.0–46.0)
HCT: 40.4 % (ref 36.0–46.0)
Hemoglobin: 15.4 g/dL — ABNORMAL HIGH (ref 12.0–15.0)
Hemoglobin: 14.3 g/dL (ref 12.0–15.0)
MCH: 32.1 pg (ref 26.0–34.0)
MCH: 32.7 pg (ref 26.0–34.0)
MCHC: 34.6 g/dL (ref 30.0–36.0)
MCHC: 35.4 g/dL (ref 30.0–36.0)
MCV: 92.7 fL (ref 80.0–100.0)
MCV: 92.4 fL (ref 80.0–100.0)
Platelets: 208 10*3/uL (ref 150–400)
Platelets: 197 10*3/uL (ref 150–400)
RBC: 4.8 MIL/uL (ref 3.87–5.11)
RBC: 4.37 MIL/uL (ref 3.87–5.11)
RDW: 12.9 % (ref 11.5–15.5)
RDW: 13 % (ref 11.5–15.5)
WBC: 21.8 10*3/uL — ABNORMAL HIGH (ref 4.0–10.5)
WBC: 26.9 10*3/uL — ABNORMAL HIGH (ref 4.0–10.5)
nRBC: 0 % (ref 0.0–0.2)
nRBC: 0 % (ref 0.0–0.2)

## 2020-09-30 LAB — TYPE AND SCREEN
ABO/RH(D): A NEG
Antibody Screen: POSITIVE

## 2020-09-30 LAB — RESP PANEL BY RT-PCR (FLU A&B, COVID) ARPGX2
Influenza A by PCR: NEGATIVE
Influenza B by PCR: NEGATIVE
SARS Coronavirus 2 by RT PCR: NEGATIVE

## 2020-09-30 LAB — RPR: RPR Ser Ql: NONREACTIVE

## 2020-09-30 MED ORDER — OXYTOCIN BOLUS FROM INFUSION
333.0000 mL | Freq: Once | INTRAVENOUS | Status: AC
  Administered 2020-09-30: 333 mL via INTRAVENOUS

## 2020-09-30 MED ORDER — PENICILLIN G POT IN DEXTROSE 60000 UNIT/ML IV SOLN: 3.0000 10*6.[IU] | INTRAVENOUS | Status: DC

## 2020-09-30 MED ORDER — TETANUS-DIPHTH-ACELL PERTUSSIS 5-2.5-18.5 LF-MCG/0.5 IM SUSY: 0.5000 mL | PREFILLED_SYRINGE | Freq: Once | INTRAMUSCULAR | Status: DC

## 2020-09-30 MED ORDER — OXYCODONE-ACETAMINOPHEN 5-325 MG PO TABS: 1.0000 | ORAL_TABLET | ORAL | Status: DC | PRN

## 2020-09-30 MED ORDER — DIPHENHYDRAMINE HCL 50 MG/ML IJ SOLN: 12.5000 mg | INTRAMUSCULAR | Status: DC | PRN

## 2020-09-30 MED ORDER — FENTANYL-BUPIVACAINE-NACL 0.5-0.125-0.9 MG/250ML-% EP SOLN
12.0000 mL/h | EPIDURAL | Status: DC | PRN
  Filled 2020-09-30: qty 250

## 2020-09-30 MED ORDER — LACTATED RINGERS IV SOLN: INTRAVENOUS | Status: DC

## 2020-09-30 MED ORDER — OXYCODONE-ACETAMINOPHEN 5-325 MG PO TABS: 2.0000 | ORAL_TABLET | ORAL | Status: DC | PRN

## 2020-09-30 MED ORDER — WITCH HAZEL-GLYCERIN EX PADS: 1.0000 "application " | MEDICATED_PAD | CUTANEOUS | Status: DC | PRN

## 2020-09-30 MED ORDER — PRENATAL MULTIVITAMIN CH
1.0000 | ORAL_TABLET | Freq: Every day | ORAL | Status: DC
  Filled 2020-09-30 (×3): qty 1

## 2020-09-30 MED ORDER — ACETAMINOPHEN 325 MG PO TABS
650.0000 mg | ORAL_TABLET | ORAL | Status: DC | PRN
  Administered 2020-09-30: 650 mg via ORAL
  Filled 2020-09-30: qty 2

## 2020-09-30 MED ORDER — EPHEDRINE 5 MG/ML INJ: 10.0000 mg | INTRAVENOUS | Status: DC | PRN

## 2020-09-30 MED ORDER — ONDANSETRON HCL 4 MG/2ML IJ SOLN: 4.0000 mg | INTRAMUSCULAR | Status: DC | PRN

## 2020-09-30 MED ORDER — SOD CITRATE-CITRIC ACID 500-334 MG/5ML PO SOLN: 30.0000 mL | ORAL | Status: DC | PRN

## 2020-09-30 MED ORDER — FENTANYL CITRATE (PF) 100 MCG/2ML IJ SOLN: 50.0000 ug | INTRAMUSCULAR | Status: DC | PRN

## 2020-09-30 MED ORDER — ZOLPIDEM TARTRATE 5 MG PO TABS: 5.0000 mg | ORAL_TABLET | Freq: Every evening | ORAL | Status: DC | PRN

## 2020-09-30 MED ORDER — SIMETHICONE 80 MG PO CHEW: 80.0000 mg | CHEWABLE_TABLET | ORAL | Status: DC | PRN

## 2020-09-30 MED ORDER — ONDANSETRON HCL 4 MG PO TABS: 4.0000 mg | ORAL_TABLET | ORAL | Status: DC | PRN

## 2020-09-30 MED ORDER — PHENYLEPHRINE 40 MCG/ML (10ML) SYRINGE FOR IV PUSH (FOR BLOOD PRESSURE SUPPORT): 80.0000 ug | PREFILLED_SYRINGE | INTRAVENOUS | Status: DC | PRN

## 2020-09-30 MED ORDER — COCONUT OIL OIL: 1.0000 "application " | TOPICAL_OIL | Status: DC | PRN

## 2020-09-30 MED ORDER — DIPHENHYDRAMINE HCL 25 MG PO CAPS: 25.0000 mg | ORAL_CAPSULE | Freq: Four times a day (QID) | ORAL | Status: DC | PRN

## 2020-09-30 MED ORDER — DIBUCAINE (PERIANAL) 1 % EX OINT: 1.0000 "application " | TOPICAL_OINTMENT | CUTANEOUS | Status: DC | PRN

## 2020-09-30 MED ORDER — ONDANSETRON HCL 4 MG/2ML IJ SOLN: 4.0000 mg | Freq: Four times a day (QID) | INTRAMUSCULAR | Status: DC | PRN

## 2020-09-30 MED ORDER — LACTATED RINGERS IV SOLN: 500.0000 mL | Freq: Once | INTRAVENOUS | Status: DC

## 2020-09-30 MED ORDER — LACTATED RINGERS IV SOLN: 500.0000 mL | INTRAVENOUS | Status: DC | PRN

## 2020-09-30 MED ORDER — IBUPROFEN 600 MG PO TABS
600.0000 mg | ORAL_TABLET | Freq: Four times a day (QID) | ORAL | Status: DC
  Administered 2020-09-30 – 2020-10-02 (×9): 600 mg via ORAL
  Filled 2020-09-30 (×9): qty 1

## 2020-09-30 MED ORDER — SENNOSIDES-DOCUSATE SODIUM 8.6-50 MG PO TABS
2.0000 | ORAL_TABLET | Freq: Every day | ORAL | Status: DC
  Administered 2020-10-01 – 2020-10-02 (×2): 2 via ORAL
  Filled 2020-09-30 (×2): qty 2

## 2020-09-30 MED ORDER — BENZOCAINE-MENTHOL 20-0.5 % EX AERO: 1.0000 "application " | INHALATION_SPRAY | CUTANEOUS | Status: DC | PRN

## 2020-09-30 MED ORDER — SODIUM CHLORIDE 0.9 % IV SOLN
5.0000 10*6.[IU] | Freq: Once | INTRAVENOUS | Status: AC
  Administered 2020-09-30: 5 10*6.[IU] via INTRAVENOUS
  Filled 2020-09-30: qty 5

## 2020-09-30 MED ORDER — ACETAMINOPHEN 325 MG PO TABS: 650.0000 mg | ORAL_TABLET | ORAL | Status: DC | PRN

## 2020-09-30 MED ORDER — OXYTOCIN-SODIUM CHLORIDE 30-0.9 UT/500ML-% IV SOLN
2.5000 [IU]/h | INTRAVENOUS | Status: DC
  Administered 2020-09-30: 2.5 [IU]/h via INTRAVENOUS
  Filled 2020-09-30: qty 500

## 2020-09-30 MED ORDER — LIDOCAINE HCL (PF) 1 % IJ SOLN: 30.0000 mL | INTRAMUSCULAR | Status: DC | PRN

## 2020-09-30 NOTE — Lactation Note (Signed)
This note was copied from a baby's chart. Lactation Consultation Note  Patient Name: Kayla Everett HXTAV'W Date: 09/30/2020 Reason for consult: Initial assessment;Primapara;1st time breastfeeding;Term;Other (Comment) (LC from nights attempted to visit mom in LD and she was in the process of getting up to the bathroom with the nurses.) Age:29 hours, P 1  Baby has had several 5 min attempts and a 25 min and 15 min feeding in L/D.  LC explained the breast feeding goals - feed with feeding cues,and 8-12 times in 24 hours. LC reassured mom baby is off to a good start.  Mom aware to page with feeding for feeding assessment and RN's and LC 's can assess latch.  LC provided the [facility] brochure with resource numbers and support group.   Maternal Data Does the patient have breastfeeding experience prior to this delivery?: No  Feeding Mother's Current Feeding Choice: Breast Milk  LATCH Score                    Lactation Tools Discussed/Used    Interventions Interventions: Breast feeding basics reviewed  Discharge    Consult Status Consult Status: Follow-up Date: 09/30/20 Follow-up type: In-patient    Matilde Sprang Abrina Petz 09/30/2020, 8:51 AM

## 2020-09-30 NOTE — Progress Notes (Signed)
CARDIOLOGY Brief note:  Thank you for notifying our service regarding this patient.  Kayla Everett is a 30 yo female presenting to the hospital in labor and is anticipated to deliver tonight. She has a prior history of ASD repair at age 16. Last echo on 08/11/20 showed no residual shunting and only mild MR. Previously she has had episodes of palpitations. Symptoms were felt to be related to her pregnancy. She was also evaluated by Dr Johney Frame for suspected atrial tachycardia. He offered medications and/or ablation. These were eventually not pursued.  Per revised antibiotic prophylaxis guidelines the patient does not warrant coverage since her last echo showed no residual shunting across the ASD repair site. Please keep [place] in the loop if the patient has any concerning findings on this hospitalization. If a formal consult is requested please let us know as well.  Donnald Garre, MD, [facility]

## 2020-09-30 NOTE — H&P (Signed)
Kayla Everett is a 30 y.o. female, G1P0000, IUP at 41.1 weeks, presenting for latent labor with cxt x12 hours. US 3/25 EFW 6.15lbs, AFI 16.8, vertex, posterior placenta. LR female. GBS+. Pt endorse + Fm. Denies vaginal leakage. Denies vaginal bleeding. Denies feeling cxt's.   Prenatal H/O with [facility]:  Anxiety (no meds)  Maternal atrial septal defect repaired ( fetal echo WNL 11/29, Hx ASD repair age 89,  MFM consult 11/2 for hx ASD repair-- Need consult with cardiology regarding appropriate ATB (? amp and/or gent) for prophylaxis due to her prior surgery. Cardiology should be notified at the time of admission for delivery to help with the management of any complications should they arise during her labor and delivery.) Group B Streptococcus carrier (Tx with PCN) Palpitations (Cards consult WNL) RhD negative (Rhogam at 30 weeks) small for gestational age fetus (19% at 30 weeks) vitamin D deficiency (18.8 at 19 weeks, recommended 4000 u daily, recheck PP.)   Patient Active Problem List   Diagnosis Date Noted  . Normal labor and delivery 09/30/2020  . Adjustment disorder with anxious mood 06/08/2020  . PND (post-nasal drip) 06/08/2020  . Palpitations 05/05/2014  . Residual ASD (atrial septal defect) following repair 05/05/2014     Medications Prior to Admission  Medication Sig Dispense Refill Last Dose  . Cholecalciferol (VITAMIN D3 GUMMIES PO) Take by mouth.   09/28/2020 at Unknown time  . MAGNESIUM OXIDE 400 PO Take by mouth.   09/28/2020 at Unknown time  . Prenatal Vit-Fe Fumarate-FA (MULTIVITAMIN-PRENATAL) 27-0.8 MG TABS tablet Take 1 tablet by mouth daily at 12 noon.   09/29/2020 at Unknown time  . doxylamine, Sleep, (UNISOM) 25 MG tablet Take 25 mg by mouth at bedtime as needed.   Unknown at Unknown time    Past Medical History:  Diagnosis Date  . Anxiety   . Depression   . Heart murmur   . Palpitations   . Residual ASD (atrial septal defect) following repair june 2012   [facility]     No current facility-administered medications on file prior to encounter.   Current Outpatient Medications on File Prior to Encounter  Medication Sig Dispense Refill  . Cholecalciferol (VITAMIN D3 GUMMIES PO) Take by mouth.    Marland Kitchen. MAGNESIUM OXIDE 400 PO Take by mouth.    . Prenatal Vit-Fe Fumarate-FA (MULTIVITAMIN-PRENATAL) 27-0.8 MG TABS tablet Take 1 tablet by mouth daily at 12 noon.    Marland Kitchen. doxylamine, Sleep, (UNISOM) 25 MG tablet Take 25 mg by mouth at bedtime as needed.       No Known Allergies  History of present pregnancy: Pt Info/Preference:  Screening/Consents:  Labs:   EDD: Estimated Date of Delivery: 09/22/20  Establised: Patient's last menstrual period was 12/17/2019.  Anatomy Scan: Date: 11/2 Placenta Location: posterior WNL Genetic Screen: Panoroma:LR female AFP:  First Tri: Quad:  Office: [facility]            First PNV: 11 wg Blood Type --/--/A NEG (03/31 0105)  Language: english Last PNV: 40.5 wg Rhogam    Flu Vaccine:  utd   Antibody POS (03/31 0105)  TDaP vaccine utd   GTT: Early: 5.0 Third Trimester: 120  Feeding Plan: breast BTL: no Rubella: Immune (09/01 0000)  Contraception: ??? VBAC: no RPR: Nonreactive (09/01 0000)   Circumcision: N/A   HBsAg: Negative (09/01 0000)  Pediatrician:  ???   HIV: Non-reactive (09/01 0000)   Prenatal Classes: no Additional US:  US 3/25 EFW 6.15lbs, AFI 16.8, vertex,  posterior placenta. LR female.  GBS: Positive/-- (09/01 0000)(For PCN allergy, check sensitivities)       Chlamydia: neg    MFM Referral/Consult: Echo 11/29 GC: neg  Support Person: husband   PAP: ???  Pain Management: natural Neonatologist Referral:  Hgb Electrophoresis:  AA  Birth Plan: Birth plan at bedside   Hgb NOB: 13    28W: 11.6   OB History    Gravida  1   Para      Term      Preterm      AB      Living        SAB      IAB      Ectopic      Multiple      Live Births             Past Medical History:  Diagnosis Date  . Anxiety    . Depression   . Heart murmur   . Palpitations   . Residual ASD (atrial septal defect) following repair june 2012   [facility]   Past Surgical History:  Procedure Laterality Date  . ASD REPAIR     Family History: family history includes Heart disease in her father and paternal grandfather; Hyperlipidemia in her father and paternal grandfather. Social History:  reports that she has never smoked. She has never used smokeless tobacco. She reports that she does not drink alcohol and does not use drugs.   Prenatal Transfer Tool  Maternal Diabetes: No Genetic Screening: Normal Maternal Ultrasounds/Referrals: Normal Fetal Ultrasounds or other Referrals:  Fetal echo WNL Maternal Substance Abuse:  No Significant Maternal Medications:  None Significant Maternal Lab Results: Group B Strep positive  ROS:  Review of Systems  Constitutional: Negative.   HENT: Negative.   Eyes: Negative.   Respiratory: Negative.   Cardiovascular: Negative.   Gastrointestinal: Positive for abdominal pain.  Genitourinary: Negative.   Musculoskeletal: Negative.   Skin: Negative.   Neurological: Negative.   Endo/Heme/Allergies: Negative.   Psychiatric/Behavioral: Negative.      Physical Exam: BP 133/80   Pulse 99   Temp 98 F (36.7 C) (Oral)   Resp 18   Ht 5\' 2"  (1.575 m)   Wt 61.7 kg   LMP 12/17/2019   BMI 24.87 kg/m   Physical Exam Vitals and nursing note reviewed. Exam conducted with a chaperone present.  Constitutional:      Appearance: Normal appearance.  HENT:     Head: Normocephalic and atraumatic.     Nose: Nose normal.     Mouth/Throat:     Mouth: Mucous membranes are moist.  Eyes:     Conjunctiva/sclera: Conjunctivae normal.     Pupils: Pupils are equal, round, and reactive to light.  Cardiovascular:     Rate and Rhythm: Normal rate and regular rhythm.     Pulses: Normal pulses.     Heart sounds: Normal heart sounds.  Pulmonary:     Effort: Pulmonary effort is normal.      Breath sounds: Normal breath sounds.  Abdominal:     General: Bowel sounds are normal.  Genitourinary:    Comments: Uterus gravida, equal to dates, pelvis adequate for vaginal delivery  Musculoskeletal:        General: Normal range of motion.     Cervical back: Normal range of motion and neck supple.  Skin:    General: Skin is warm.     Capillary Refill: Capillary refill takes less than 2 seconds.  Neurological:     General: No focal deficit present.     Mental Status: She is alert.  Psychiatric:        Mood and Affect: Mood normal.      NST: FHR baseline 140 bpm, Variability: moderate, Accelerations:present, Decelerations:  Absent= Cat 1/Reactive UC:   regular, every 2-3 minutes SVE:   Dilation: 5 Effacement (%): 80 Station: -1 Exam by:: Garry Bochicchio CNM, vertex verified by fetal sutures.  Leopold's: Position vertex, EFW 6.5lbs via leopold's.   Labs: Results for orders placed or performed during the hospital encounter of 09/29/20 (from the past 24 hour(s))  Resp Panel by RT-PCR (Flu A&B, Covid) Nasopharyngeal Swab     Status: None   Collection Time: 09/30/20 12:55 AM   Specimen: Nasopharyngeal Swab; Nasopharyngeal(NP) swabs in vial transport medium  Result Value Ref Range   SARS Coronavirus 2 by RT PCR NEGATIVE NEGATIVE   Influenza A by PCR NEGATIVE NEGATIVE   Influenza B by PCR NEGATIVE NEGATIVE  CBC     Status: Abnormal   Collection Time: 09/30/20 12:55 AM  Result Value Ref Range   WBC 21.8 (H) 4.0 - 10.5 K/uL   RBC 4.80 3.87 - 5.11 MIL/uL   Hemoglobin 15.4 (H) 12.0 - 15.0 g/dL   HCT 18.5 63.1 - 49.7 %   MCV 92.7 80.0 - 100.0 fL   MCH 32.1 26.0 - 34.0 pg   MCHC 34.6 30.0 - 36.0 g/dL   RDW 02.6 37.8 - 58.8 %   Platelets 208 150 - 400 K/uL   nRBC 0.0 0.0 - 0.2 %  Type and screen [facility]     Status: None   Collection Time: 09/30/20  1:05 AM  Result Value Ref Range   ABO/RH(D) A NEG    Antibody Screen POS    Sample Expiration       10/03/2020,2359 Performed at [facility], [address], [place], [place] [address]     Imaging:  No results found.  MAU Course: Orders Placed This Encounter  Procedures  . Resp Panel by RT-PCR (Flu A&B, Covid) Nasopharyngeal Swab  . CBC  . RPR  . Diet clear liquid Room service appropriate? Yes; Fluid consistency: Thin  . Vitals signs per unit policy  . Notify Physician  . Fetal monitoring per unit policy  . Activity as tolerated  . Cervical Exam  . Measure blood pressure post delivery every 15 min x 1 hour then every 30 min x 1 hour  . Fundal check post delivery every 15 min x 1 hour then every 30 min x 1 hour  . If Rapid HIV test positive or known HIV positive: initiate AZT orders  . May in and out cath x 2 for inability to void  . Discontinue foley prior to vaginal delivery  . Initiate Carrier Fluid Protocol  . Initiate Oral Care Protocol  . Order Rapid HIV per protocol if no results on chart  . Patient may have epidural placement upon request  . May use local infiltration of 1% lidocaine plain to produce a skin wheal prior to IV insertion  . Notify in-house Anesthesia team of nausea and vomiting greater than 5 hours  . Assess for signs/symptoms of PIH/preeclampsia  . RN to place order for: CBC if one has not been drawn in the past 6 hours for all patients with hypertensive disease, pre-eclampsia, eclampsia, thrombocytopenia or previous PLTC<150,000.  . Identify to Anesthesia if patient plans to have postpartum tubal ligation;  do not remove epidural without discussion with Anesthesiologist  . Vital signs following Epidural Placement, re-bolus or re-dose monitor patient's BP and oxygen saturation every 5 minutes for 30 minutes  . RN to remain at bedside continuously for 30 minutes post epidural placement, post re-bolus / re-dose  . Notify Anesthesia if the patient becomes short of breath or complains of heaviness in chest, chest pain, and/or unrelieved pain  . Notify  Anesthesia prior to discontinuing epidural infusion  . If the patient plans to have a postpartum tubal ligation, cover epidural catheter with non-injectable port cap and tape to patient's shoulder  . Notify Anesthesia MD Immediately post-epidural for:  Marland Kitchen Fetal monitoring  . Patient may shower  . Full code  . Type and screen [facility]  . Insert and maintain IV Line  . Epidural catheter may be discontinued following delivery if hemodynamically stable with no active bleeding and no post-partum tubal planned. Contact Anesthesiology for any complications.  . Admit to Inpatient (patient's expected length of stay will be greater than 2 midnights or inpatient only procedure)   Meds ordered this encounter  Medications  . lactated ringers infusion  . oxytocin (PITOCIN) IV BOLUS FROM BAG  . oxytocin (PITOCIN) IV infusion 30 units in NS 500 mL - Premix  . lactated ringers infusion 500-1,000 mL  . acetaminophen (TYLENOL) tablet 650 mg  . oxyCODONE-acetaminophen (PERCOCET/ROXICET) 5-325 MG per tablet 1 tablet  . oxyCODONE-acetaminophen (PERCOCET/ROXICET) 5-325 MG per tablet 2 tablet  . ondansetron (ZOFRAN) injection 4 mg  . sodium citrate-citric acid (ORACIT) solution 30 mL  . lidocaine (PF) (XYLOCAINE) 1 % injection 30 mL  . FOLLOWED BY Linked Order Group   . penicillin G potassium 5 Million Units in sodium chloride 0.9 % 250 mL IVPB     Order Specific Question:   Antibiotic Indication:     Answer:   Group B Strep Prophylaxis   . penicillin G potassium 3 Million Units in dextrose 59mL IVPB     Order Specific Question:   Antibiotic Indication:     Answer:   Group B Strep Prophylaxis  . fentaNYL (SUBLIMAZE) injection 50-100 mcg  . ePHEDrine injection 10 mg  . PHENYLephrine 40 mcg/ml in normal saline Adult IV Push Syringe (For Blood Pressure Support)  . lactated ringers infusion 500 mL  . fentaNYL 2 mcg/mL w/ bupivacaine 0.125% in NS 250 mL epidural infusion ([facility]-ANES)  .  diphenhydrAMINE (BENADRYL) injection 12.5 mg  . ePHEDrine injection 10 mg  . PHENYLephrine 40 mcg/ml in normal saline Adult IV Push Syringe (For Blood Pressure Support)    Assessment/Plan: Kayla Everett is a 30 y.o. female, G1P0000, IUP at 41.1 weeks, presenting for latent labor with cxt x12 hours. [place] 3/25 EFW 6.15lbs, AFI 16.8, vertex, posterior placenta. LR female. GBS+. Pt endorse + Fm. Denies vaginal leakage. Denies vaginal bleeding. Denies feeling cxt's.   Prenatal H/O with [facility]:  Anxiety (no meds)  Maternal atrial septal defect repaired ( fetal echo WNL 11/29, Hx ASD repair age 50,  MFM consult 11/2 for hx ASD repair-- Need consult with cardiology regarding appropriate ATB (? amp and/or gent) for prophylaxis due to her prior surgery. Cardiology should be notified at the time of admission for delivery to help with the management of any complications should they arise during her labor and delivery.) Group B Streptococcus carrier (Tx with PCN) Palpitations (Cards consult WNL) RhD negative (Rhogam at 30 weeks) small for gestational age fetus (19% at 30 weeks) vitamin  D deficiency (18.8 at 19 weeks, recommended 4000 u daily, recheck PP.)  FWB: Cat 1 Fetal Tracing.   Plan: Admit to [facility] Cardiology aware of pt admission, stated they do not need to be notified and does not have abx tx recommendations. .  Routine [facility] orders Pain med/epidural prn PCN G for GBS prophylaxis  Expectant management  Anticipate labor progression   Dale Kerr NP-C, CNM, MSN 09/30/2020, 2:43 AM

## 2020-09-30 NOTE — Lactation Note (Signed)
This note was copied from a baby's chart. Lactation Consultation Note  Patient Name: Kayla Everett [id number]'[id number] Date: 09/30/2020 Reason for consult: Follow-up assessment;Primapara;1st time breastfeeding;Term Age:29 hours/ 2nd LC visit  Baby latched when [facility] entered the room with depth in the cross cradle  With swallows. Baby fed for 14 mins and nipple well rounded when baby released. Per mom the feeding was comfortable.  LC reviewed breast feeding basics.   Maternal Data Does the patient have breastfeeding experience prior to this delivery?: No Per mom some breast changes.  Feeding Mother's Current Feeding Choice: Breast Milk  LATCH Score Latch:  (latched depth)  Audible Swallowing: A few with stimulation  Type of Nipple:  (nipple well rounded when baby released)  Comfort (Breast/Nipple):  (per mom comfortable)  Hold (Positioning):  (mom latched the baby)  LATCH Score: 8   Lactation Tools Discussed/Used    Interventions Interventions: Breast feeding basics reviewed;Skin to skin;Support pillows  Discharge Pump: Personal WIC Program: No  Consult Status Consult Status: Follow-up Date: 10/01/20 Follow-up type: In-patient    Matilde Sprang [name] 09/30/2020, 9:56 AM

## 2020-09-30 NOTE — Lactation Note (Signed)
This note was copied from a baby's chart. Lactation Consultation Note Baby just hr old. Attempted to seem mom. Mom getting up to BR. RN stated that Baby BF for 25 minutes then 15 minutes.  Patient Name: Girl Aadya Kindler [id number] Date: 09/30/2020   Age:29 hours  Maternal Data    Feeding    LATCH Score Latch: Grasps breast easily, tongue down, lips flanged, rhythmical sucking.  Audible Swallowing: A few with stimulation  Type of Nipple: Everted at rest and after stimulation  Comfort (Breast/Nipple): Soft / non-tender  Hold (Positioning): Assistance needed to correctly position infant at breast and maintain latch.  LATCH Score: 8   Lactation Tools Discussed/Used    Interventions    Discharge    Consult Status      Charyl Dancer 09/30/2020, 5:29 AM

## 2020-09-30 NOTE — Social Work (Addendum)
CSW received consult for hx of Anxiety and Depression.  CSW met with [name] to offer support and complete assessment.    CSW met with the patient at beside. CSW introduced role and congratulated [name] and FOB. [name] receptive to the visit.  CSW observed [name] holding and bonding with the infant. CSW offered [name] privacy and explain HIPPA. [name] agreeable for the FOB to stay in the room. CSW asked [name] how she feels emotionally. [name] reports, "I am really good, and tired."  CSW asked [name] about pregnancy. [name] reports having a good pregnancy, no concerns.  CSW assessed [name] for safety, [name] denies suicidal and homicidal thoughts.   CSW asked [name] about her mental health history. [name] reports she has a history of anxiety and depression. [name] reports she had anxiety for as long as she can remember and did not get treatment until she was an adult. [name] reports she was diagnosed with depression after she had heart surgery at age 19. [name] reports in the past she has been prescribed several antidepressants but has not taken medication in two years. [name] reports she is currently in therapy and sees a therapist at [facility]. [name] reports therapy has been very helpful. [name] reports the therapist also specializes in postpartum mood disorders. CSW provided education regarding the baby blues period vs. perinatal mood disorders. [name] already has a plan to see her therapist if concerns arise.  CSW recommended [name] complete a self-evaluation during the postpartum time period using the New Mom Checklist from Postpartum Progress and encouraged [name] to contact a medical professional if symptoms are noted at any time. [name] reports understanding and receptive to the resource. CSW asked [name] about her supports. [name] reports her support are the FOB, her parents and extended family.   CSW provided review of Sudden Infant Death Syndrome (SIDS) precautions and informed [name] no co-sleeping with the infant. [name] reports understanding. CSW asked [name]  if she has items for the infant. [name] reports she has items for the infant, including a car seat and bassinet. [name] chose [facility] in [place].  CSW assessed for additional needs. [name] reports no further needs.    CSW identifies no further need for intervention and no barriers to discharge at this time.  Leidi Astle, MSW, LCSW [facility]  Clinical Social Worker  [phone] 09/30/2020  1:15 PM    

## 2020-10-01 ENCOUNTER — Inpatient Hospital Stay (HOSPITAL_COMMUNITY): Payer: PRIVATE HEALTH INSURANCE

## 2020-10-01 ENCOUNTER — Inpatient Hospital Stay (HOSPITAL_COMMUNITY)
Admission: AD | Admit: 2020-10-01 | Payer: PRIVATE HEALTH INSURANCE | Source: Home / Self Care | Admitting: Obstetrics & Gynecology

## 2020-10-01 NOTE — Progress Notes (Signed)
Subjective: Postpartum Day # 1 : S/P NSVD due to pt was admitted on 3/31 with latent labor, progress with AROM due to fetal strip Cat 2, resolved to Cat 1 after arom, gbs+ tx x1 with pcn,  to SVD over 1st degree not repaired, with EBL of , with hgb drop of 15.4-14.3, RHG-, newborn O- no rhogam indicated.  Mood stable, h/o anxiety. H/O ASD repair at 78, cards aware, pt stable HR WNL 80s. Patient up ad lib, denies syncope or dizziness. Patient up ad lib, denies syncope or dizziness. Reports consuming regular diet without issues and denies N/V. Patient reports 0 bowel movement + passing flatus.  Denies issues with urination and reports bleeding is "lighter."  Patient is breastfeeding and reports going well.  Desires undecided for postpartum contraception.  Pain is being appropriately managed with use of po meds.   1st degree laceration Feeding:  breast Contraceptive plan:  undecided Baby female  Objective: Vital signs in last 24 hours: Patient Vitals for the past 24 hrs:  BP Temp Temp src Pulse Resp SpO2  10/01/20 1433 119/69 98.7 F (37.1 C) Oral 62 16 98 %  10/01/20 0533 115/76 98.2 F (36.8 C) Oral (!) 53 18 100 %  09/30/20 1923 135/85 98.8 F (37.1 C) Oral 62 18 100 %  09/30/20 1545 123/71 98.2 F (36.8 C) -- 65 16 --     Physical Exam:  General: alert, cooperative, appears stated age and no distress Mood/Affect: happy Lungs: clear to auscultation, no wheezes, rales or rhonchi, symmetric air entry.  Heart: normal rate, regular rhythm, normal S1, S2, no murmurs, rubs, clicks or gallops. Breast: breasts appear normal, no suspicious masses, no skin or nipple changes or axillary nodes. Abdomen:  + bowel sounds, soft, non-tender GU: perineum approximate, healing well. No signs of external hematomas.  Uterine Fundus: firm Lochia: appropriate Skin: Warm, Dry. DVT Evaluation: No evidence of DVT seen on physical exam. Negative Homan's sign. No cords or calf tenderness. No  significant calf/ankle edema.  CBC Latest Ref Rng & Units 09/30/2020 09/30/2020  WBC 4.0 - 10.5 K/uL 26.9(H) 21.8(H)  Hemoglobin 12.0 - 15.0 g/dL 21.1 15.4(H)  Hematocrit 36.0 - 46.0 % 40.4 44.5  Platelets 150 - 400 K/uL 197 208    No results found for this or any previous visit (from the past 24 hour(s)).   CBG (last 3)  No results for input(s): GLUCAP in the last 72 hours.   I/O last 3 completed shifts: In: 0  Out: 125 [Blood:125]   Assessment Postpartum Day # 1 : S/P NSVD due to pt was admitted on 3/31 with latent labor, progress with AROM due to fetal strip Cat 2, resolved to Cat 1 after arom, gbs+ tx x1 with pcn,  to SVD over 1st degree not repaired, with EBL of , with hgb drop of 15.4-14.3, RHG-, newborn O- no rhogam indicated. Patient up ad lib, denies syncope or dizziness. Reports consuming regular diet without issues and denies N/V. Pt stable. -1 involution. breastfeeding. Hemodynamically stable. Mood stable, h/o anxiety. H/O ASD repair at 89, cards aware, pt stable HR WNL 80s.   Plan: Continue other mgmt as ordered Anxiety: No meds monitor mood.  H/O ASD repair at 69: F/U with cards PRN out pt.  VTE prophylactics: Early ambulated as tolerates.  Pain control: Motrin/Tylenol PRN Education given regarding options for contraception, including barrier methods, injectable contraception, IUD placement, oral contraceptives.  Plan for discharge tomorrow, Breastfeeding and Lactation consult   Dr. Su Hilt to  be updated on patient status  [facility] NP-C, CNM 10/01/2020, 3:06 PM

## 2020-10-01 NOTE — Lactation Note (Signed)
This note was copied from a baby's chart. Lactation Consultation Note  Patient Name: Kayla Everett URKYH'C Date: 10/01/2020 Reason for consult: Follow-up assessment;Primapara;1st time breastfeeding;Term;Infant weight loss;Nipple pain/trauma Age:29 hours  Baby awake after RN assessment , and rooting.  LC  assisted mom to latch in the football position, depth obtained and baby fed for 12 mins with increased swallows with compressions. Per mom initially some discomfort and improved when LC ease down the chin.  Per  Mom feels milk is coming , and easily hand expresses the milk with large drops. LC encouraged mom to use her EBM to nipples liberally , comfort gels after feedings and alternating with shells while awake.  Per mom has a [employer] manual pump from home and LC recommended for mom to use if her milk comes in tonight and needs to release the fullness.  LC reviewed engorgement prevention and tx.    Maternal Data Has patient been taught Hand Expression?: Yes  Feeding Mother's Current Feeding Choice: Breast Milk  LATCH Score Latch: Grasps breast easily, tongue down, lips flanged, rhythmical sucking.  Audible Swallowing: Spontaneous and intermittent  Type of Nipple: Everted at rest and after stimulation  Comfort (Breast/Nipple): Filling, red/small blisters or bruises, mild/mod discomfort  Hold (Positioning): Assistance needed to correctly position infant at breast and maintain latch.  LATCH Score: 8   Lactation Tools Discussed/Used Tools: Shells  Interventions Interventions: Breast feeding basics reviewed;Position options;Comfort gels;Shells;Education  Discharge Pump: Personal;Manual;DEBP  Consult Status Consult Status: Follow-up Date: 10/02/20 Follow-up type: In-patient    Matilde Sprang Janssen Zee 10/01/2020, 3:35 PM

## 2020-10-02 MED ORDER — IBUPROFEN 600 MG PO TABS: 600.0000 mg | ORAL_TABLET | Freq: Four times a day (QID) | ORAL | 0 refills | Status: DC

## 2020-10-02 NOTE — Discharge Summary (Signed)
Postpartum Discharge Summary  Date of Service updated 10/02/20     Patient Name: Kayla Everett DOB: 1991-03-24 MRN: [id number]  Date of admission: 09/29/2020 Delivery date:09/30/2020  Delivering provider: Noralyn Pick  Date of discharge: 10/02/2020  Admitting diagnosis: Normal labor and delivery [O80] Intrauterine pregnancy: [redacted]w[redacted]d    Secondary diagnosis:  Active Problems:   Normal labor and delivery   SVD (spontaneous vaginal delivery)   Normal postpartum course  Additional problems:  Patient Active Problem List   Diagnosis Date Noted  . Normal labor and delivery 09/30/2020  . SVD (spontaneous vaginal delivery) 09/30/2020  . Normal postpartum course 09/30/2020  . Adjustment disorder with anxious mood 06/08/2020  . Palpitations 05/05/2014  . Residual ASD (atrial septal defect) following repair 05/05/2014       Discharge diagnosis: Term Pregnancy Delivered                                              Post partum procedures:none Augmentation: AROM Complications: None  Hospital course: Onset of Labor With Vaginal Delivery      30y.o. yo G1P1001 at 426w1das admitted in Latent Labor on 09/29/2020. Patient had an uncomplicated labor course as follows:  Membrane Rupture Time/Date: 3:19 AM ,09/30/2020   Delivery Method:Vaginal, Spontaneous  Episiotomy: None  Lacerations:  1st degree  Patient had an uncomplicated postpartum course.  She is ambulating, tolerating a regular diet, passing flatus, and urinating well. Patient is discharged home in stable condition on 10/02/20.  Newborn Data: Birth date:09/30/2020  Birth time:4:15 AM  Gender:Female  Living status:Living  Apgars:8 ,9  Weight:2809 g   Magnesium Sulfate received: No BMZ received: No Rhophylac:Yes , in 3rd trimester, baby is Rh neg MMR:N/A Transfusion:No  Physical exam  Vitals:   10/01/20 0533 10/01/20 1433 10/01/20 2125 10/02/20 0553  BP: 115/76 119/69 117/78 117/79  Pulse: (!) 53 62 (!) 58 (!) 58   Resp: _0 Temp: 98.2 F (36.8 C) 98.7 F (37.1 C) 98.6 F (37 C) 97.9 F (36.6 C)  TempSrc: Oral Oral Oral Oral  SpO2: 100% 98% 99% 99%  Weight:      Height:       General: alert, cooperative and no distress Lochia: appropriate Uterine Fundus: firm Perineum: non-edematous, well-approximated DVT Evaluation: No evidence of DVT seen on physical exam. No cords or calf tenderness. No significant calf/ankle edema. Labs: Lab Results  Component Value Date   WBC 26.9 (H) 09/30/2020   HGB 14.3 09/30/2020   HCT 40.4 09/30/2020   MCV 92.4 09/30/2020   PLT 197 09/30/2020   No flowsheet data found. Edinburgh Score: Edinburgh Postnatal Depression Scale Screening Tool 10/01/2020  I have been able to laugh and see the funny side of things. 0  I have looked forward with enjoyment to things. 0  I have blamed myself unnecessarily when things went wrong. 0  I have been anxious or worried for no good reason. 0  I have felt scared or panicky for no good reason. 0  Things have been getting on top of me. 0  I have been so unhappy that I have had difficulty sleeping. 0  I have felt sad or miserable. 0  I have been so unhappy that I have been crying. 0  The thought of harming myself has occurred to me. 0  Edinburgh Postnatal Depression  Scale Total 0      After visit meds:  Allergies as of 10/02/2020   No Known Allergies      Medication List     TAKE these medications    butalbital-acetaminophen-caffeine 50-325-40 MG tablet Commonly known as: FIORICET Take 1 tablet by mouth every 4 (four) hours as needed for headache.   diphenhydramine-acetaminophen 25-500 MG Tabs tablet Commonly known as: TYLENOL PM Take 1 tablet by mouth at bedtime.   Evening Primrose Oil 1000 MG Caps Take 1-2 capsules by mouth in the morning and at bedtime. Takes 1 capsule orally twice daily and places one capsule vaginally at bedtime.   fluticasone 50 MCG/ACT nasal spray Commonly known as:  FLONASE Place 1 spray into both nostrils daily.   ibuprofen 600 MG tablet Commonly known as: ADVIL Take 1 tablet (600 mg total) by mouth every 6 (six) hours.   magnesium oxide 400 MG tablet Commonly known as: MAG-OX Take 400 mg by mouth daily.   omega-3 acid ethyl esters 1 g capsule Commonly known as: LOVAZA Take 1 g by mouth at bedtime.   prenatal multivitamin Tabs tablet Take 1 tablet by mouth daily at 12 noon.   Vitamin D 50 MCG (2000 UT) tablet Take 2,000 Units by mouth daily.         Discharge home in stable condition Infant Feeding: Breast Infant Disposition:home with mother Discharge instruction: per After Visit Summary and Postpartum booklet. Activity: Advance as tolerated. Pelvic rest for 6 weeks.  Diet: low salt diet Anticipated Birth Control: IUD Postpartum Appointment:6 weeks Additional Postpartum F/U: none Future Appointments:No future appointments. Follow up Visit:  [facility]. Schedule an appointment as soon as possible for a visit in 6 week(s).   Specialty: Obstetrics and Gynecology Contact information: [address] [place]  [id number] [phone]                    10/02/2020 Arrie Eastern, CNM

## 2020-10-02 NOTE — Lactation Note (Signed)
This note was copied from a baby's chart. Lactation Consultation Note  Patient Name: Girl Shamikia Linskey [id number] Date: 10/02/2020 Reason for consult: Follow-up assessment Age:29 hours  Baby 41 w1d.  Mother denies questions or concerns. Feed on demand with cues.  Goal 8-12+ times per day after first 24 hrs.  Reviewed engorgement care and monitoring voids/stools.  Suggest calling if further assistance is needed.   Feeding Mother's Current Feeding Choice: Breast Milk  LATCH Score Latch: Grasps breast easily, tongue down, lips flanged, rhythmical sucking.  Audible Swallowing: Spontaneous and intermittent  Type of Nipple: Everted at rest and after stimulation  Comfort (Breast/Nipple): Filling, red/small blisters or bruises, mild/mod discomfort  Hold (Positioning): No assistance needed to correctly position infant at breast.  LATCH Score: 9    Interventions Interventions: Education  Discharge Discharge Education: Engorgement and breast care;Warning signs for feeding baby  Consult Status Consult Status: Complete Date: 10/02/20    Dahlia Byes [facility] 10/02/2020, 10:40 AM

## 2020-10-11 ENCOUNTER — Telehealth (HOSPITAL_COMMUNITY): Payer: Self-pay | Admitting: Lactation Services

## 2020-11-02 ENCOUNTER — Ambulatory Visit: Payer: PRIVATE HEALTH INSURANCE | Attending: Obstetrics and Gynecology | Admitting: Physical Therapy

## 2020-11-02 ENCOUNTER — Encounter: Payer: Self-pay | Admitting: Physical Therapy

## 2020-11-02 ENCOUNTER — Other Ambulatory Visit: Payer: Self-pay

## 2020-11-02 DIAGNOSIS — M545 Low back pain, unspecified: Secondary | ICD-10-CM | POA: Insufficient documentation

## 2020-11-02 DIAGNOSIS — M546 Pain in thoracic spine: Secondary | ICD-10-CM | POA: Diagnosis present

## 2020-11-02 DIAGNOSIS — G8929 Other chronic pain: Secondary | ICD-10-CM | POA: Diagnosis present

## 2020-11-02 DIAGNOSIS — M6281 Muscle weakness (generalized): Secondary | ICD-10-CM | POA: Insufficient documentation

## 2020-11-02 DIAGNOSIS — R252 Cramp and spasm: Secondary | ICD-10-CM | POA: Diagnosis present

## 2020-11-02 NOTE — Therapy (Signed)
[facility] [address], [address] [place], [place], [address] Phone: [phone]   Fax:  [phone]  Physical Therapy Evaluation  Patient Details  Name: Kayla Everett MRN: [id number] Date of Birth: June 26, 1991 Referring Provider (PT): Nigel Bridgeman, [place]   Encounter Date: 11/02/2020   PT End of Session - 11/02/20 1453    Visit Number 1    Date for PT Re-Evaluation 12/28/20    Authorization Type Preferred on    Authorization Time Period through 07/02/21    Authorization - Visit Number 1    PT Start Time 1453    PT Stop Time 1530    PT Time Calculation (min) 37 min    Activity Tolerance Patient tolerated treatment well    Behavior During Therapy [facility] for tasks assessed/performed           Past Medical History:  Diagnosis Date  . Anxiety   . Depression   . Heart murmur   . Palpitations   . Residual ASD (atrial septal defect) following repair june 2012   [facility]    Past Surgical History:  Procedure Laterality Date  . ASD REPAIR      There were no vitals filed for this visit.    Subjective Assessment - 11/02/20 1452    Subjective Pt is a previous referred to PT for low back pain.  She delivered her first baby on 09/30/20.  This back pain is similar to what she had prior to being pregnant.  Caring for newborn and nursing has contributed.    Pertinent History lactating, had baby 09/30/20, ASD repair, scoliosis    Limitations Other (comment);Standing;Sitting   holding newborn, nursing   Patient Stated Goals loosen up back    Currently in Pain? Yes    Pain Score 5     Pain Location Back    Pain Orientation Mid;Left;Right    Pain Descriptors / Indicators Tightness;Aching    Pain Type Chronic pain    Pain Radiating Towards ribcage    Pain Onset More than a month ago    Pain Frequency Intermittent    Aggravating Factors  holding newborn, nursing, sitting depending on position    Pain Relieving Factors heat,  warm shower, stretches helps somewhat    Effect of Pain on Daily Activities care of newborn, takes Advil for sleep comfort              [facility] PT Assessment - 11/02/20 0001      Assessment   Medical Diagnosis low back pain    Prior Therapy yes during pregnancy      Precautions   Precaution Comments lactating, gave birth 09/30/20      Prior Function   Level of Independence Independent    Vocation Requirements on maternity leave    Leisure horseback riding, yoga      Observation/Other Assessments   Focus on Therapeutic Outcomes (FOTO)  72% goal 79%      Functional Tests   Functional tests Squat;Single leg stance      Squat   Comments WNL      Single Leg Stance   Comments WNL      Posture/Postural Control   Posture/Postural Control Postural limitations    Posture Comments Rt thoracic torsion present      ROM / Strength   AROM / PROM / Strength AROM;PROM;Strength      AROM   Overall AROM Comments trunk ROM WNL with exception of thoracic rotation  AROM Assessment Site Thoracic    Thoracic - Right Rotation limited 20%    Thoracic - Left Rotation limited 20%      PROM   Overall PROM Comments bil hip ROM WNL      Strength   Overall Strength Comments bil hips 4+/5, core single finger diastasis above and at umbilicus, good activation of lumbar multifidus      Flexibility   Soft Tissue Assessment /Muscle Length no      Palpation   Spinal mobility rib springing limited upper t-spine Rt>Lt    SI assessment  WNL    Palpation comment Rt pectorals, Rt rhomboid, Rt levator scap, Rt QL, bil thoracic paraspinals                      Objective measurements completed on examination: See above findings.       [facility] Adult PT Treatment/Exercise - 11/02/20 0001      Self-Care   Self-Care Other Self-Care Comments    Other Self-Care Comments  initial HEP trial and handouts given                  PT Education - 11/02/20 1530    Education Details  Access Code: [email or web address]            PT Short Term Goals - 11/02/20 1745      PT SHORT TERM GOAL #1   Title Pt will be ind with initial HEP    Time 3    Period Weeks    Status New    Target Date 11/23/20      PT SHORT TERM GOAL #2   Title Pt will demo proper activation of deep lower abdominals in standing with good alignment holding 8lb ankle weight on shoulder to simulate holding newborn.    Time 3    Period Weeks    Status New    Target Date 11/23/20      PT SHORT TERM GOAL #3   Title Pt will be educated on propping and positioning options for nursing her newborn.    Time 3    Period Weeks    Status New    Target Date 11/23/20             PT Long Term Goals - 11/02/20 1748      PT LONG TERM GOAL #1   Title Pt will be ind with advanced HEP and understand how to safely progress    Time 8    Period Weeks    Status New    Target Date 12/28/20      PT LONG TERM GOAL #2   Title Pt will demo proper body mechanics and core use with bend, lift and carry 15lb at midline to simulate newborn care techniques.    Time 8    Period Weeks    Status New    Target Date 12/28/20      PT LONG TERM GOAL #3   Title Pt will report at least 60% reduction in pain with daily tasks related to caring for newborn.    Time 8    Period Weeks    Status New    Target Date 12/28/20      PT LONG TERM GOAL #4   Title Pt will improve FOTO score from 72% to at least 79% to demo improved function.    Time 8    Period Weeks    Status New    Target  Date 12/28/20      PT LONG TERM GOAL #5   Title Pt will achieve symmetrical posture and tone across thoracic spine through stretching and stabilization to reduce pain.    Time 8    Period Weeks    Status New    Target Date 12/28/20                  Plan - 11/02/20 1735    Clinical Impression Statement Pt is 4.5 weeks s/p delivery of her first child on 09/30/20.  She was cleared by MD for exercise.  She is referred for LBP which she  experienced prior to pregancy.  Pain is located in mid and lower back along both sides of trunk.  She is also having some Rt posterior shoulder pain.  Pain is exacerbated with holding her newborn and nursing.  She always holds her baby on her Lt shoulder.  She presents with Rt thoracic torsion with Rt QL and lumbar paraspinal tension > Lt.  She has a single finger diastasis at and above umbilicus.  Trunk and hip ROM are WNL.  SI joints are WNL and negative for pain.  Strength in bil LEs is grossly 4+/5.  Rt humerus rests in IR and TPs are present in Rt intrascapular soft tissues.  Rib springing is limited in Rt upper posterior quadrant.  Pt will benefit from skilled PT for Pt education, manual techniques, development of HEP for thoracic ROM and gentle spine stretching and core retraining following pregnancy/delivery.    Personal Factors and Comorbidities Comorbidity 1    Comorbidities open heart surgery when younger    Examination-Activity Limitations Carry;Lift;Caring for Others;Sit;Stand    Examination-Participation Restrictions Other   newborn care   Stability/Clinical Decision Making Stable/Uncomplicated    Clinical Decision Making Low    Rehab Potential Excellent    PT Frequency 2x / week   Pt may only be able to attend 1x/week for her schedule   PT Duration 8 weeks    PT Treatment/Interventions ADLs/Self Care Home Management;Moist Heat;Electrical Stimulation;Functional mobility training;Therapeutic activities;Therapeutic exercise;Neuromuscular re-education;Manual techniques;Patient/family education;Dry needling;Joint Manipulations;Spinal Manipulations;Passive range of motion    PT Next Visit Plan f/u on initial HEP, STM Rt QL and lumbar paraspinals, Rt intrascapular mobs and TP release, ongoing Pt education for newborn care for positioning/body mechanics, gentle core and PF cueing    PT Home Exercise Plan Access Code: [email or web address] + tennis ball against wall for TP release Rt intrascapular region     Consulted and Agree with Plan of Care Patient           Patient will benefit from skilled therapeutic intervention in order to improve the following deficits and impairments:  Decreased range of motion,Decreased strength,Improper body mechanics,Impaired flexibility,Increased muscle spasms,Hypomobility,Postural dysfunction,Pain  Visit Diagnosis: Chronic midline low back pain without sciatica - Plan: PT plan of care cert/re-cert  Pain in thoracic spine - Plan: PT plan of care cert/re-cert  Cramp and spasm - Plan: PT plan of care cert/re-cert  Muscle weakness (generalized) - Plan: PT plan of care cert/re-cert     Problem List Patient Active Problem List   Diagnosis Date Noted  . Normal labor and delivery 09/30/2020  . SVD (spontaneous vaginal delivery) 09/30/2020  . Normal postpartum course 09/30/2020  . Adjustment disorder with anxious mood 06/08/2020  . Palpitations 05/05/2014  . Residual ASD (atrial septal defect) following repair 05/05/2014    Morton PetersJohanna Mishawn Didion, PT 11/02/20 5:54 PM   [facility] [address], [address] [place], [place], [address] Phone: [phone]   Fax:  [phone]  Name: Kayla Everett MRN: [id number] Date of Birth: [birth year]

## 2020-11-02 NOTE — Patient Instructions (Signed)
Access Code: [email or web address] URL: [email or web address] Date: 11/02/2020 Prepared by: Loistine Simas Shevon Sian  Exercises Cat-Camel - 1 x daily - 7 x weekly - 1 sets - 10 reps Cat-Camel to Child's Pose - 1 x daily - 7 x weekly - 1 sets - 10 reps Quadruped Thoracic Rotation Full Range with Hand on Neck - 1 x daily - 7 x weekly - 1 sets - 10 reps Sidelying Thoracic Rotation with Open Book - 1 x daily - 7 x weekly - 1 sets - 10 reps Single Arm Doorway Pec Stretch at 90 Degrees Abduction - 1 x daily - 7 x weekly - 1 sets - 1 reps - 20 hold

## 2020-11-10 ENCOUNTER — Ambulatory Visit: Payer: PRIVATE HEALTH INSURANCE | Admitting: Physical Therapy

## 2020-11-15 ENCOUNTER — Encounter: Payer: Self-pay | Admitting: Physical Therapy

## 2020-11-15 ENCOUNTER — Ambulatory Visit: Payer: PRIVATE HEALTH INSURANCE | Admitting: Physical Therapy

## 2020-11-15 ENCOUNTER — Other Ambulatory Visit: Payer: Self-pay

## 2020-11-15 DIAGNOSIS — R252 Cramp and spasm: Secondary | ICD-10-CM

## 2020-11-15 DIAGNOSIS — M546 Pain in thoracic spine: Secondary | ICD-10-CM

## 2020-11-15 DIAGNOSIS — M6281 Muscle weakness (generalized): Secondary | ICD-10-CM

## 2020-11-15 DIAGNOSIS — G8929 Other chronic pain: Secondary | ICD-10-CM

## 2020-11-15 DIAGNOSIS — M545 Low back pain, unspecified: Secondary | ICD-10-CM | POA: Diagnosis not present

## 2020-11-15 NOTE — Therapy (Signed)
[facility] [address] [place], [place], [address] Phone: [phone]   Fax:  [phone]  Physical Therapy Treatment  Patient Details  Name: Kayla Everett MRN: [id number] Date of Birth: 07/21/1990 Referring Provider (PT): Nigel Bridgeman, [place]   Encounter Date: 11/15/2020   PT End of Session - 11/15/20 0853    Visit Number 2    Date for PT Re-Evaluation 12/28/20    Authorization Type Preferred one    Authorization Time Period through 07/02/21    Authorization - Visit Number 2    PT Start Time 0848    PT Stop Time 0931    PT Time Calculation (min) 43 min    Activity Tolerance Patient tolerated treatment well    Behavior During Therapy [facility] for tasks assessed/performed           Past Medical History:  Diagnosis Date  . Anxiety   . Depression   . Heart murmur   . Palpitations   . Residual ASD (atrial septal defect) following repair june 2012   [facility]    Past Surgical History:  Procedure Laterality Date  . ASD REPAIR      There were no vitals filed for this visit.   Subjective Assessment - 11/15/20 0851    Subjective Overall stiffness vs pain in neck/upper back.  I haven't been able to do the HEP as much due to newborn needs.    Pertinent History lactating, had baby 09/30/20, ASD repair, scoliosis    Limitations Other (comment);Standing;Sitting    Patient Stated Goals loosen up back    Currently in Pain? Yes    Pain Score 3     Pain Location Back    Pain Orientation Right;Left;Mid;Upper    Pain Descriptors / Indicators Tightness    Pain Type Chronic pain    Pain Onset More than a month ago    Pain Frequency Intermittent    Aggravating Factors  holding and feeding newborn    Pain Relieving Factors heat, warm shower, stretch                             [facility] Adult PT Treatment/Exercise - 11/15/20 0001      Exercises   Exercises Shoulder;Lumbar;Knee/Hip;Neck       Neck Exercises: Seated   Other Seated Exercise upper trap stretch bil 20 sec    Other Seated Exercise seated thoracic rotation with overpressure 2x10 sec      Lumbar Exercises: Stretches   Other Lumbar Stretch Exercise thoracic ext supine over foam roll x 10 reps    Other Lumbar Stretch Exercise lay along foam roller Ys and Ws with arms x 10      Lumbar Exercises: Standing   Row Strengthening;Power tower;15 reps    Row Limitations 35lb    Shoulder Extension Strengthening;Both;5 reps    Shoulder Extension Limitations 5 sec for core canister activation    Other Standing Lumbar Exercises shoulder flexion yellow 5x5" for core canister activation, hands to hips      Lumbar Exercises: Seated   Sit to Stand 15 reps    Sit to Stand Limitations yellow band at knees, PT demo and VC to avoid lumbar ext with stand to sit      Lumbar Exercises: Quadruped   Madcat/Old Horse 10 reps    Madcat/Old Horse Limitations TC for thoracic ext    Single Arm Raise Left;Right    Single  Arm Raises Limitations 3 each    Other Quadruped Lumbar Exercises thread needle to thoracic rotation reach x 3 each side      Shoulder Exercises: Supine   Horizontal ABduction Strengthening;10 reps;Theraband    Theraband Level (Shoulder Horizontal ABduction) Level 2 (Red)      Manual Therapy   Manual Therapy Soft tissue mobilization;Joint mobilization    Joint Mobilization thoracic PAs and rib springing bil Rt>Lt, ribcage sideglides    Soft tissue mobilization Rt thoracic and lumbar paraspinals, Rt QL, Rt rhomboids, upper trap, levator                    PT Short Term Goals - 11/15/20 1245      PT SHORT TERM GOAL #1   Title Pt will be ind with initial HEP    Status On-going             PT Long Term Goals - 11/02/20 1748      PT LONG TERM GOAL #1   Title Pt will be ind with advanced HEP and understand how to safely progress    Time 8    Period Weeks    Status New    Target Date 12/28/20      PT  LONG TERM GOAL #2   Title Pt will demo proper body mechanics and core use with bend, lift and carry 15lb at midline to simulate newborn care techniques.    Time 8    Period Weeks    Status New    Target Date 12/28/20      PT LONG TERM GOAL #3   Title Pt will report at least 60% reduction in pain with daily tasks related to caring for newborn.    Time 8    Period Weeks    Status New    Target Date 12/28/20      PT LONG TERM GOAL #4   Title Pt will improve FOTO score from 72% to at least 79% to demo improved function.    Time 8    Period Weeks    Status New    Target Date 12/28/20      PT LONG TERM GOAL #5   Title Pt will achieve symmetrical posture and tone across thoracic spine through stretching and stabilization to reduce pain.    Time 8    Period Weeks    Status New    Target Date 12/28/20                 Plan - 11/15/20 0911    Clinical Impression Statement Pt continues to carry and feed mostly using Rt UE and reported tightness in Rt>Lt neck and upper/mid back.  She hasn't had as much time to focus on HEP due to newborn care.  She presents with ongoing tightness in Rt paraspinals and has TPs in Rt upper trap and levator.  Thoracic PAs and Rt rib springing are limited.  PT performed manual techniques to address soft tissue and joint limitations alongside spine ROM and stretching with good relief and release reported end of session.  Pt is able to tolerate light core activation and postural stength which will benefit from progressing over time to improve strength for newborn care.    Comorbidities open heart surgery when younger    PT Frequency 2x / week    PT Duration 8 weeks    PT Treatment/Interventions ADLs/Self Care Home Management;Moist Heat;Electrical Stimulation;Functional mobility training;Therapeutic activities;Therapeutic exercise;Neuromuscular re-education;Manual techniques;Patient/family education;Dry needling;Joint  Manipulations;Spinal Manipulations;Passive  range of motion    PT Next Visit Plan progress postural strength (scapular, core, functional mobility), manual therapy for thoracic mobs and STM along spine and Rt upper quadrant    PT Home Exercise Plan Access Code: [email or web address] + tennis ball against wall for TP release Rt intrascapular region    Consulted and Agree with Plan of Care Patient           Patient will benefit from skilled therapeutic intervention in order to improve the following deficits and impairments:     Visit Diagnosis: Chronic midline low back pain without sciatica  Pain in thoracic spine  Cramp and spasm  Muscle weakness (generalized)     Problem List Patient Active Problem List   Diagnosis Date Noted  . Normal labor and delivery 09/30/2020  . SVD (spontaneous vaginal delivery) 09/30/2020  . Normal postpartum course 09/30/2020  . Adjustment disorder with anxious mood 06/08/2020  . Palpitations 05/05/2014  . Residual ASD (atrial septal defect) following repair 05/05/2014    Morton Peters, PT 11/15/20 12:46 PM   [facility] [address] [place], [place], [address] Phone: [phone]   Fax:  [phone]  Name: Kayla Everett MRN: [id number] Date of Birth: 12-24-1990

## 2020-11-18 ENCOUNTER — Ambulatory Visit: Payer: PRIVATE HEALTH INSURANCE | Admitting: Nurse Practitioner

## 2020-11-30 ENCOUNTER — Other Ambulatory Visit: Payer: Self-pay

## 2020-11-30 ENCOUNTER — Ambulatory Visit: Payer: PRIVATE HEALTH INSURANCE | Admitting: Physical Therapy

## 2020-11-30 ENCOUNTER — Encounter: Payer: Self-pay | Admitting: Physical Therapy

## 2020-11-30 DIAGNOSIS — M6281 Muscle weakness (generalized): Secondary | ICD-10-CM

## 2020-11-30 DIAGNOSIS — M545 Low back pain, unspecified: Secondary | ICD-10-CM

## 2020-11-30 DIAGNOSIS — R252 Cramp and spasm: Secondary | ICD-10-CM

## 2020-11-30 DIAGNOSIS — M546 Pain in thoracic spine: Secondary | ICD-10-CM

## 2020-11-30 DIAGNOSIS — G8929 Other chronic pain: Secondary | ICD-10-CM

## 2020-11-30 NOTE — Therapy (Signed)
[facility] [address] [place], [place], [address] Phone: [phone]   Fax:  [phone]  Physical Therapy Treatment  Patient Details  Name: Kayla Everett MRN: [id number] Date of Birth: 1991-04-07 Referring Provider (PT): Nigel Bridgeman, [place]   Encounter Date: 11/30/2020   PT End of Session - 11/30/20 0854    Visit Number 3    Date for PT Re-Evaluation 12/28/20    Authorization Type Preferred one    Authorization Time Period through 07/02/21    Authorization - Visit Number 3    PT Start Time 0845    PT Stop Time 0930    PT Time Calculation (min) 45 min    Activity Tolerance Patient tolerated treatment well    Behavior During Therapy [facility] for tasks assessed/performed           Past Medical History:  Diagnosis Date  . Anxiety   . Depression   . Heart murmur   . Palpitations   . Residual ASD (atrial septal defect) following repair june 2012   [facility]    Past Surgical History:  Procedure Laterality Date  . ASD REPAIR      There were no vitals filed for this visit.   Subjective Assessment - 11/30/20 0852    Subjective I am sore, feel like I tweaked my Rt back.  It is a little lower than it used to be.  I can usually loosen it up with stretching but can't seem to.  I also am just really busy with the baby.    Pertinent History lactating, had baby 09/30/20, ASD repair, scoliosis    Limitations Other (comment);Standing;Sitting    Patient Stated Goals loosen up back    Currently in Pain? Yes    Pain Score 2     Pain Location Back    Pain Orientation Right;Mid;Lower    Pain Descriptors / Indicators Tightness    Pain Type Chronic pain    Pain Onset More than a month ago    Pain Frequency Intermittent    Pain Relieving Factors heat                             [facility] Adult PT Treatment/Exercise - 11/30/20 0001      Exercises   Exercises Lumbar;Knee/Hip;Other Exercises     Other Exercises  verbal review and progression of HEP, Pt familiar with exercises so not done during session to allow manual therapy time      Lumbar Exercises: Stretches   Lower Trunk Rotation 3 reps;20 seconds    Lower Trunk Rotation Limitations PT cued active reach Rt LE pelvis to knee for increased stretch    Piriformis Stretch 2 reps;20 seconds;Left;Right    Other Lumbar Stretch Exercise butterfly stretch bil 3x30      Modalities   Modalities Moist Heat      Moist Heat Therapy   Number Minutes Moist Heat 10 Minutes   concurrent with stretching, Pt educ   Moist Heat Location Lumbar Spine      Manual Therapy   Manual Therapy Soft tissue mobilization;Myofascial release;Joint mobilization    Joint Mobilization Rt scapular mobility for posterior tipping, retraction, depression (to counter tight pec minor impact)    Soft tissue mobilization Rt QL, obliques, paraspinals t-spine and l-spine in Lt SL, deep myofascial release along iliac crest and obliques, Rt pec minor release in supine  PT Short Term Goals - 11/15/20 1245      PT SHORT TERM GOAL #1   Title Pt will be ind with initial HEP    Status On-going             PT Long Term Goals - 11/02/20 1748      PT LONG TERM GOAL #1   Title Pt will be ind with advanced HEP and understand how to safely progress    Time 8    Period Weeks    Status New    Target Date 12/28/20      PT LONG TERM GOAL #2   Title Pt will demo proper body mechanics and core use with bend, lift and carry 15lb at midline to simulate newborn care techniques.    Time 8    Period Weeks    Status New    Target Date 12/28/20      PT LONG TERM GOAL #3   Title Pt will report at least 60% reduction in pain with daily tasks related to caring for newborn.    Time 8    Period Weeks    Status New    Target Date 12/28/20      PT LONG TERM GOAL #4   Title Pt will improve FOTO score from 72% to at least 79% to demo improved  function.    Time 8    Period Weeks    Status New    Target Date 12/28/20      PT LONG TERM GOAL #5   Title Pt will achieve symmetrical posture and tone across thoracic spine through stretching and stabilization to reduce pain.    Time 8    Period Weeks    Status New    Target Date 12/28/20                 Plan - 11/30/20 1043    Clinical Impression Statement Pt presented with less Rt thoracic torsion today compared to previous visits but still felt tightness in Rt shoulder and Rt ribcage and lumbar region.  She has increased soft tissue tension and myofascial restriction in Rt pec minor, thoracic and lumbar paraspinals, obliques, QL and associated fascia which benefits from skilled manual techniques for release, mobility and elongation.  She has limited Rt scapular mobility secondary to Rt pec minor which improved with manual therapy today.  Pt continues to overuse Rt dominant side when caring for, nursing and carrying her newborn which likely contributes. PT updated HEP verbally/with handouts while Pt had lumbar heat/performed stretching given she is familiar with these exercises but needed guidance on safe return to them following delivery.  Pt plans to schedule more visits as this was her last scheduled visit.  She will continue to benefit from skilled PT for manual therapy and guidance on return to core and pelvic stability following delivery of her first child.    Comorbidities open heart surgery when younger    PT Frequency 2x / week    PT Duration 8 weeks    PT Treatment/Interventions ADLs/Self Care Home Management;Moist Heat;Electrical Stimulation;Functional mobility training;Therapeutic activities;Therapeutic exercise;Neuromuscular re-education;Manual techniques;Patient/family education;Dry needling;Joint Manipulations;Spinal Manipulations;Passive range of motion    PT Next Visit Plan review HEP, continue manual release as needed for Rt lower trunk (paraspinals, QL, obliques,  fascia) and Rt pec minor, Rt scapular mobilization to counter impact of tight pec minor    PT Home Exercise Plan Access Code: [email or web address] + tennis ball against wall for TP release  Rt intrascapular region    Consulted and Agree with Plan of Care Patient           Patient will benefit from skilled therapeutic intervention in order to improve the following deficits and impairments:     Visit Diagnosis: Chronic midline low back pain without sciatica  Pain in thoracic spine  Cramp and spasm  Muscle weakness (generalized)  Chronic bilateral low back pain without sciatica     Problem List Patient Active Problem List   Diagnosis Date Noted  . Normal labor and delivery 09/30/2020  . SVD (spontaneous vaginal delivery) 09/30/2020  . Normal postpartum course 09/30/2020  . Adjustment disorder with anxious mood 06/08/2020  . Palpitations 05/05/2014  . Residual ASD (atrial septal defect) following repair 05/05/2014    Morton Peters, PT 11/30/20 10:49 AM   [facility] [address] [place], [place], [address] Phone: [phone]   Fax:  [phone]  Name: Kayla Everett MRN: [id number] Date of Birth: [birth year]

## 2020-11-30 NOTE — Patient Instructions (Signed)
Access Code: [email or web address] URL: [email or web address] Date: 11/30/2020 Prepared by: Loistine Simas Ofilia Rayon  Exercises Cat-Camel - 1 x daily - 7 x weekly - 1 sets - 10 reps Cat-Camel to Child's Pose - 1 x daily - 7 x weekly - 1 sets - 10 reps Quadruped Thoracic Rotation Full Range with Hand on Neck - 1 x daily - 7 x weekly - 1 sets - 10 reps Sidelying Thoracic Rotation with Open Book - 1 x daily - 7 x weekly - 1 sets - 10 reps Single Arm Doorway Pec Stretch at 90 Degrees Abduction - 1 x daily - 7 x weekly - 1 sets - 1 reps - 20 hold Clamshell - 1 x daily - 7 x weekly - 3 sets - 10 reps Supine Shoulder Horizontal Abduction with Resistance - 1 x daily - 7 x weekly - 3 sets - 10 reps Supine Shoulder External Rotation with Resistance - 1 x daily - 7 x weekly - 3 sets - 10 reps Wall Squat with Swiss Ball and Ball Between Knees - 1 x daily - 7 x weekly - 2 sets - 10 reps Bird Dog - 1 x daily - 7 x weekly - 1 sets - 10 reps Bird Dog with Knee Taps - 1 x daily - 7 x weekly - 1 sets - 10 reps Standing Shoulder Row with Anchored Resistance - 1 x daily - 7 x weekly - 2 sets - 15 reps

## 2020-12-06 ENCOUNTER — Other Ambulatory Visit: Payer: Self-pay

## 2020-12-06 ENCOUNTER — Encounter: Payer: Self-pay | Admitting: Physical Therapy

## 2020-12-06 ENCOUNTER — Ambulatory Visit: Payer: PRIVATE HEALTH INSURANCE | Attending: Obstetrics and Gynecology | Admitting: Physical Therapy

## 2020-12-06 DIAGNOSIS — M546 Pain in thoracic spine: Secondary | ICD-10-CM | POA: Insufficient documentation

## 2020-12-06 DIAGNOSIS — G8929 Other chronic pain: Secondary | ICD-10-CM | POA: Diagnosis present

## 2020-12-06 DIAGNOSIS — M545 Low back pain, unspecified: Secondary | ICD-10-CM | POA: Diagnosis not present

## 2020-12-06 DIAGNOSIS — M6281 Muscle weakness (generalized): Secondary | ICD-10-CM

## 2020-12-06 NOTE — Therapy (Signed)
[facility] [address] [place], [place], [address] Phone: [phone]   Fax:  [phone]  Physical Therapy Treatment  Patient Details  Name: Kayla Everett MRN: [id number] Date of Birth: 08-14-1990 Referring Provider (PT): Nigel Bridgeman, [place]   Encounter Date: 12/06/2020   PT End of Session - 12/06/20 0803    Visit Number 4    Date for PT Re-Evaluation 12/28/20    Authorization Type Preferred one    Authorization Time Period through 07/02/21    Authorization - Visit Number 4    PT Start Time 0805    PT Stop Time 0847    PT Time Calculation (min) 42 min    Activity Tolerance Patient tolerated treatment well    Behavior During Therapy [facility] for tasks assessed/performed           Past Medical History:  Diagnosis Date  . Anxiety   . Depression   . Heart murmur   . Palpitations   . Residual ASD (atrial septal defect) following repair june 2012   [facility]    Past Surgical History:  Procedure Laterality Date  . ASD REPAIR      There were no vitals filed for this visit.   Subjective Assessment - 12/06/20 0804    Subjective I spent a lot of time in the car going to [place] and back.  My left side of my back is tight today and the central low back is sore.    Pertinent History lactating, had baby 09/30/20, ASD repair, scoliosis    Limitations Other (comment);Standing;Sitting    Patient Stated Goals loosen up back    Currently in Pain? Yes    Pain Score 3     Pain Location Back    Pain Orientation Right;Mid;Lower    Pain Descriptors / Indicators Tightness    Pain Type Chronic pain    Pain Onset More than a month ago    Pain Frequency Intermittent    Aggravating Factors  newborn care - hold and feed    Pain Relieving Factors heat, stretching    Effect of Pain on Daily Activities care of newborn                             [facility] Adult PT Treatment/Exercise - 12/06/20 0001       Self-Care   Self-Care Posture    Posture while holding 9lb ankle weight draped over each shoulder with VC/TC for posture and proper muscle support, simulating holding newborn      Exercises   Exercises Lumbar;Knee/Hip;Shoulder      Lumbar Exercises: Aerobic   Elliptical L5 incline 5 x 2' warm up, PT present to discuss pain and plan for session      Lumbar Exercises: Standing   Other Standing Lumbar Exercises 1/2 deadlift to 5 rows, 5 cycles, 5lb bil dumbbell      Lumbar Exercises: Seated   Other Seated Lumbar Exercises on BOSO sit ups holding 5lb at chest, small range, straight back      Lumbar Exercises: Supine   Dead Bug 10 reps    Dead Bug Limitations opp arm/leg heel slide 3lb dumbbell UE      Lumbar Exercises: Sidelying   Clam Both;15 reps    Clam Limitations PT cued stacked pelvis      Lumbar Exercises: Quadruped   Madcat/Old Horse 5 reps    Straight Leg Raise 5 reps  Straight Leg Raises Limitations on elbows    Opposite Arm/Leg Raise 5 reps;Left arm/Right leg;Right arm/Left leg    Other Quadruped Lumbar Exercises child's pose 3-way x 10 sec each      Shoulder Exercises: Power Set designer 25lb bil UEs without rotation then with rotation x 10 each, bil      Manual Therapy   Manual Therapy Soft tissue mobilization    Soft tissue mobilization stripping and broadening bil paraspinals and QL, obliques myofascial release posterior to anterior                    PT Short Term Goals - 11/15/20 1245      PT SHORT TERM GOAL #1   Title Pt will be ind with initial HEP    Status On-going             PT Long Term Goals - 11/02/20 1748      PT LONG TERM GOAL #1   Title Pt will be ind with advanced HEP and understand how to safely progress    Time 8    Period Weeks    Status New    Target Date 12/28/20      PT LONG TERM GOAL #2   Title Pt will demo proper body mechanics and core use with bend, lift and carry 15lb at midline  to simulate newborn care techniques.    Time 8    Period Weeks    Status New    Target Date 12/28/20      PT LONG TERM GOAL #3   Title Pt will report at least 60% reduction in pain with daily tasks related to caring for newborn.    Time 8    Period Weeks    Status New    Target Date 12/28/20      PT LONG TERM GOAL #4   Title Pt will improve FOTO score from 72% to at least 79% to demo improved function.    Time 8    Period Weeks    Status New    Target Date 12/28/20      PT LONG TERM GOAL #5   Title Pt will achieve symmetrical posture and tone across thoracic spine through stretching and stabilization to reduce pain.    Time 8    Period Weeks    Status New    Target Date 12/28/20                 Plan - 12/06/20 0826    Clinical Impression Statement Pt was in car for hours over weekend driving to [place] and back.  She arrived with Lt sided tightness and continues to have central low back pain as she recovers her core strength from pregnancy.  She continues to carry her newborn on her Lt shoulder with trunk lateral shift and extension which exacerbates soft tissue imbalance and pain.  She did report her Rt shoulder pain was eliminated after treatment last visit.  She gets tight along paraspinals at T/L jxn as she tends to compensate for core with paraspinals.  She needed VCs to avoid T/L hinge with deadlifts initially but then improved.  She noted increased tightness in back wtih diagonal chops today.  Introduced SL clam for first time since delivery today with intermittent need for TA TC for ongoing engagement, likely due to fatigue.  We practiced draping 9lb ankle weights over bil shoulders with discussion of core activation and  proper alignmment to avoid postural compensation.  Pt is making progress toward her goals and will continue to benefit from skilled PT.    Comorbidities open heart surgery when younger    PT Frequency 2x / week    PT Duration 8 weeks    PT  Treatment/Interventions ADLs/Self Care Home Management;Moist Heat;Electrical Stimulation;Functional mobility training;Therapeutic activities;Therapeutic exercise;Neuromuscular re-education;Manual techniques;Patient/family education;Dry needling;Joint Manipulations;Spinal Manipulations;Passive range of motion    PT Next Visit Plan has Pt tried holding baby over Rt shoulder?  continue manual and core stabilization, functional dynamic strength    PT Home Exercise Plan Access Code: [email or web address] + tennis ball against wall for TP release Rt intrascapular region           Patient will benefit from skilled therapeutic intervention in order to improve the following deficits and impairments:     Visit Diagnosis: Chronic midline low back pain without sciatica  Pain in thoracic spine  Muscle weakness (generalized)     Problem List Patient Active Problem List   Diagnosis Date Noted  . Normal labor and delivery 09/30/2020  . SVD (spontaneous vaginal delivery) 09/30/2020  . Normal postpartum course 09/30/2020  . Adjustment disorder with anxious mood 06/08/2020  . Palpitations 05/05/2014  . Residual ASD (atrial septal defect) following repair 05/05/2014    Morton Peters, PT 12/06/20 8:54 AM   [facility] [address] [place], [place], [address] Phone: [phone]   Fax:  [phone]  Name: KADIA ABAYA MRN: [id number] Date of Birth: 05-27-1991

## 2020-12-15 ENCOUNTER — Telehealth: Payer: Self-pay | Admitting: Cardiovascular Disease

## 2020-12-15 NOTE — Telephone Encounter (Signed)
Spoke with pt regarding pain that started shortly after having her baby.  Pt gave birth on 09/30/20 and began having what she describes as chest wall pain that feels to be in her rib cage on the right side and radiates toward the middle of her sternum where her scar is.  Pt states that the pain subsides when she changes positions, she usually rolls onto her back and to the left side. Pt states that she is breastfeeding and that they first thought that she might have something going on with her breast, clogged duct, etc. Since then her OB and lactation consultant have deemed that these things are not the issue. Pt's OB suggested that she follow up with her cardiologist just to make sure this problem isn't cardiac related.  Pt does have a cardiac history that has been followed by Kayla Everett.  Able to find appointment for pt to follow up with Kayla Everett. Pt given ED precautions. Pt verbalizes understanding.

## 2020-12-15 NOTE — Telephone Encounter (Signed)
Pt c/o of Chest Pain: STAT if CP now or developed within 24 hours  1. Are you having CP right now? no  2. Are you experiencing any other symptoms (ex. SOB, nausea, vomiting, sweating)? No. The patient just states it is excruciating and wakes her up at night   3. How long have you been experiencing CP? Since she had her daughter in March  4. Is your CP continuous or coming and going? Comes and goes   5. Have you taken Nitroglycerin? No  Patient states she notices this pain when she is sleeping on her right side. It wakes her up and once she shifts to her left side it resolves itself. It has been going on for about 3 months. She was just told to follow up per her OB. She is not sure if it can wait until October for her 6 mo f/u ?

## 2020-12-24 ENCOUNTER — Other Ambulatory Visit: Payer: Self-pay

## 2020-12-24 ENCOUNTER — Ambulatory Visit: Payer: PRIVATE HEALTH INSURANCE | Admitting: Physical Therapy

## 2020-12-24 ENCOUNTER — Encounter: Payer: Self-pay | Admitting: Physical Therapy

## 2020-12-24 DIAGNOSIS — M546 Pain in thoracic spine: Secondary | ICD-10-CM

## 2020-12-24 DIAGNOSIS — M545 Low back pain, unspecified: Secondary | ICD-10-CM

## 2020-12-24 DIAGNOSIS — G8929 Other chronic pain: Secondary | ICD-10-CM

## 2020-12-24 DIAGNOSIS — M6281 Muscle weakness (generalized): Secondary | ICD-10-CM

## 2020-12-24 NOTE — Therapy (Signed)
[facility] [address], [place], [place], [address] Phone: [phone]   Fax:  [phone]  Physical Therapy Treatment  Patient Details  Name: Kayla Everett MRN: [id number] Date of Birth: [birth year] Referring Provider (PT): Donnel Saxon, [place]   Encounter Date: 12/24/2020   PT End of Session - 12/24/20 0804     Visit Number 5    Date for PT Re-Evaluation 02/18/21    Authorization Type Preferred one    Authorization Time Period through 07/02/21    Authorization - Visit Number 5    PT Start Time 0805    PT Stop Time 0354    PT Time Calculation (min) 38 min    Activity Tolerance Patient tolerated treatment well    Behavior During Therapy [facility] for tasks assessed/performed             Past Medical History:  Diagnosis Date   Anxiety    Depression    Heart murmur    Palpitations    Residual ASD (atrial septal defect) following repair june 2012   [facility]    Past Surgical History:  Procedure Laterality Date   ASD REPAIR      There were no vitals filed for this visit.   Subjective Assessment - 12/24/20 0805     Subjective Things are about the same but that's on me b/c I haven't been great about doing my HEP.  This is my first week back to work since having the baby.  I do feel better sitting in my office chair.  I am working on alternating sides for carrying the baby.    Pertinent History lactating, had baby 09/30/20, ASD repair, scoliosis    Limitations Other (comment);Standing;Sitting    Patient Stated Goals loosen up back    Currently in Pain? Yes    Pain Score 2     Pain Location Back    Pain Orientation Right;Left;Mid;Lower    Pain Descriptors / Indicators Tightness    Pain Type Chronic pain    Pain Onset More than a month ago    Pain Frequency Intermittent                [facility] PT Assessment - 12/24/20 0001       Assessment   Medical Diagnosis LBP    Referring Provider (PT)  Donnel Saxon, CNM    Prior Therapy yes during pregnancy      Precautions   Precaution Comments lactating      Prior Function   Vocation Full time employment    Vocation Requirements sittting, just returned to work    Leisure horseback riding, yoga      Observation/Other Assessments   Focus on Therapeutic Outcomes (FOTO)  80%      Posture/Postural Control   Posture Comments Rt thoracic torsion present      AROM   Overall AROM Comments WNL with end range restriction into Lt trunk rotation and bil SB with contralateral stretch      Palpation   Palpation comment tight and tender diaphragm Rt>Lt, increased tissue turgor and tone Rt thoracic and lumbar paraspinals contributing to Rt thoracic torsion                           [facility] Adult PT Treatment/Exercise - 12/24/20 0001       Lumbar Exercises: Supine   Dead Bug 10 reps    Dead Bug Limitations 3lb UE  dumbbell, from 90/90 legs      Lumbar Exercises: Quadruped   Madcat/Old Horse 5 reps    Straight Leg Raise 5 reps    Straight Leg Raises Limitations on elbows, hold 3 sec    Opposite Arm/Leg Raise 5 reps    Opposite Arm/Leg Raise Limitations hip ext w/ TA cue    Other Quadruped Lumbar Exercises TA 5x5" holds with TC by PT and VC for neutral pelvic      Shoulder Exercises: Seated   Row Strengthening;Left;15 reps    Row Weight (lbs) 8    Row Limitations diagonal dumbbell deadlift to row with rotation for Lt thoracic flexion to ext/rot      Manual Therapy   Manual Therapy Soft tissue mobilization;Myofascial release    Soft tissue mobilization bil diaphragm with breath cueing, Rt QL and lumbar paraspinals    Myofascial Release bil obliques                      PT Short Term Goals - 12/24/20 0804       PT SHORT TERM GOAL #1   Title Pt will be ind with initial HEP    Status Achieved      PT SHORT TERM GOAL #2   Title Pt will demo proper activation of deep lower abdominals in standing with  good alignment holding 8lb ankle weight on shoulder to simulate holding newborn.    Status Achieved      PT SHORT TERM GOAL #3   Title Pt will be educated on propping and positioning options for nursing her newborn.    Status Achieved               PT Long Term Goals - 12/24/20 0807       PT LONG TERM GOAL #1   Title Pt will be ind with advanced HEP and understand how to safely progress    Time 8    Status On-going      PT LONG TERM GOAL #2   Title Pt will demo proper body mechanics and core use with bend, lift and carry 15lb at midline to simulate newborn care techniques.    Status On-going      PT LONG TERM GOAL #3   Title Pt will report at least 60% reduction in pain with daily tasks related to caring for newborn.    Baseline 30%    Time 8    Period Weeks    Status On-going    Target Date 02/18/21      PT LONG TERM GOAL #4   Title Pt will improve FOTO score from 72% to at least 79% to demo improved function.    Baseline 80%    Time 8    Status Achieved      PT LONG TERM GOAL #5   Title Pt will achieve symmetrical posture and tone across thoracic spine through stretching and stabilization to reduce pain.    Status On-going                   Plan - 12/24/20 0941     Clinical Impression Statement Pt has attended 5 sessions of PT since giving birth.  She has met all STGs and is making progress toward LTGs.  She has just returned to work this week which requires sitting/computer work.  She admits she hasn't had as much time for HEP given newborn care demands.  She has returned to riding her horse.  FOTO score has improved from 72% to 80% meeting LTG.  She describes symptoms as tightness that can spread from mid/low back upwards to neck and shoulders.  She continues to present with some Rt thoracic torsion and has A/ROM WNL with soft tissue stretch end range Lt rotation and bil SB.  Abdominal wall is tender with improving deep core activation although is quick to  fatigue.  She has some tenderness and tension in oblique fascia and diaphram bil.  STM targeting these tissues performed today with tolerance but uncomfortable at times with diaphragm release.  PT recommended sprinking in HEP when she can and considering [employer] stroller exercise group for both the social and exercise benefits.  Pt will continue to benefit from skilled PT to address soft tissue and postural dysfunction as she returns to more active phase of life following delivery.    Personal Factors and Comorbidities Comorbidity 1    Comorbidities open heart surgery when younger    Examination-Activity Limitations Carry;Lift;Caring for Others;Sit;Stand    Stability/Clinical Decision Making Stable/Uncomplicated    Rehab Potential Excellent    PT Frequency 1x / week    PT Duration 8 weeks    PT Treatment/Interventions ADLs/Self Care Home Management;Moist Heat;Electrical Stimulation;Functional mobility training;Therapeutic activities;Therapeutic exercise;Neuromuscular re-education;Manual techniques;Patient/family education;Dry needling;Joint Manipulations;Spinal Manipulations;Passive range of motion    PT Next Visit Plan continue core stabilization, seated 8lb diag row Rt to Lt (dumbbell in Lt UE), open book with breathwork, STM as needed    PT Home Exercise Plan Access Code: [email or web address]    Consulted and Agree with Plan of Care Patient             Patient will benefit from skilled therapeutic intervention in order to improve the following deficits and impairments:  Decreased range of motion, Decreased strength, Improper body mechanics, Impaired flexibility, Increased muscle spasms, Hypomobility, Postural dysfunction, Pain  Visit Diagnosis: Chronic midline low back pain without sciatica - Plan: PT plan of care cert/re-cert  Pain in thoracic spine - Plan: PT plan of care cert/re-cert  Muscle weakness (generalized) - Plan: PT plan of care cert/re-cert     Problem List Patient Active  Problem List   Diagnosis Date Noted   Normal labor and delivery 09/30/2020   SVD (spontaneous vaginal delivery) 09/30/2020   Normal postpartum course 09/30/2020   Adjustment disorder with anxious mood 06/08/2020   Palpitations 05/05/2014   Residual ASD (atrial septal defect) following repair 05/05/2014    Baruch Merl, PT 12/24/20 9:49 AM    [facility] [address] [place], [place], [address] Phone: [phone]   Fax:  [phone]  Name: Kayla Everett MRN: [id number] Date of Birth: 1990/09/26

## 2020-12-31 ENCOUNTER — Ambulatory Visit: Payer: PRIVATE HEALTH INSURANCE | Attending: Obstetrics and Gynecology | Admitting: Physical Therapy

## 2020-12-31 ENCOUNTER — Other Ambulatory Visit: Payer: Self-pay

## 2020-12-31 ENCOUNTER — Encounter: Payer: Self-pay | Admitting: Physical Therapy

## 2020-12-31 DIAGNOSIS — G8929 Other chronic pain: Secondary | ICD-10-CM | POA: Insufficient documentation

## 2020-12-31 DIAGNOSIS — M546 Pain in thoracic spine: Secondary | ICD-10-CM | POA: Insufficient documentation

## 2020-12-31 DIAGNOSIS — R252 Cramp and spasm: Secondary | ICD-10-CM | POA: Diagnosis present

## 2020-12-31 DIAGNOSIS — M6281 Muscle weakness (generalized): Secondary | ICD-10-CM | POA: Insufficient documentation

## 2020-12-31 DIAGNOSIS — M545 Low back pain, unspecified: Secondary | ICD-10-CM | POA: Insufficient documentation

## 2020-12-31 NOTE — Therapy (Signed)
[facility] [address] [place], [place], [address] Phone: [phone]   Fax:  [phone]  Physical Therapy Treatment  Patient Details  Name: Kayla Everett MRN: [id number] Date of Birth: 09/29/1990 Referring Provider (PT): Nigel Bridgeman, [place]   Encounter Date: 12/31/2020   PT End of Session - 12/31/20 0930     Visit Number 6    Date for PT Re-Evaluation 02/18/21    Authorization Type Preferred one    Authorization Time Period through 07/02/21    Authorization - Visit Number 6    PT Start Time 0847    PT Stop Time 0930    PT Time Calculation (min) 43 min    Activity Tolerance Patient tolerated treatment well    Behavior During Therapy [facility] for tasks assessed/performed             Past Medical History:  Diagnosis Date   Anxiety    Depression    Heart murmur    Palpitations    Residual ASD (atrial septal defect) following repair june 2012   [facility]    Past Surgical History:  Procedure Laterality Date   ASD REPAIR      There were no vitals filed for this visit.   Subjective Assessment - 12/31/20 0853     Subjective I have been doing some breathing and self-release for my diaphragm which I think is helping.  My Rt side of my back is flared up again.  It gets worse as I try to soothe and hold/nurse the baby - and towards the end of the day.    Pertinent History lactating, had baby 09/30/20, ASD repair, scoliosis    Patient Stated Goals loosen up back    Currently in Pain? Yes    Pain Score 2     Pain Location Back    Pain Orientation Right;Mid    Pain Descriptors / Indicators Tightness    Pain Type Chronic pain    Pain Onset More than a month ago    Pain Frequency Intermittent    Aggravating Factors  as day goes on and caring for baby    Pain Relieving Factors heat, stretching, hot shower                               [facility] Adult PT Treatment/Exercise - 12/31/20 0001        Exercises   Exercises Lumbar;Knee/Hip;Shoulder      Lumbar Exercises: Aerobic   Recumbent Bike L2 x 3' PT present to discuss sypmtoms      Lumbar Exercises: Supine   Dead Bug 10 reps    Dead Bug Limitations 4lb UE dumbbell    Bridge with [employer] 10 reps    Bridge with [employer] Limitations VC for PF awareness      Lumbar Exercises: Sidelying   Clam Both;10 reps    Clam Limitations 2x5, in side plank on knees    Other Sidelying Lumbar Exercises open book x 3 with lateral costal expansion cue and active top leg reach: unable to perform into Rt rot today due to pain      Lumbar Exercises: Quadruped   Madcat/Old Horse 10 reps    Opposite Arm/Leg Raise Left arm/Right leg;Right arm/Left leg;10 reps    Opposite Arm/Leg Raise Limitations hold 5 sec    Other Quadruped Lumbar Exercises quadruped rotation hand on back of head x 5 reps  each Rt/Lt    Other Quadruped Lumbar Exercises thread the needle x 5 each way      Knee/Hip Exercises: Standing   Lateral Step Up 1 set;10 reps;Step Height: 4"    Lateral Step Up Limitations with oblique knee driver/contralateral march 4lb UE dumbbell    Functional Squat 1 set;10 reps    Functional Squat Limitations 10lb kbell row    SLS on foam pad, march to 90/90, 3x10 sec Rt/Lt      Shoulder Exercises: Power Tower   Extension 20 reps    Extension Limitations 15lb from 90 flexion to arms at sides for TA awareness                      PT Short Term Goals - 12/24/20 0804       PT SHORT TERM GOAL #1   Title Pt will be ind with initial HEP    Status Achieved      PT SHORT TERM GOAL #2   Title Pt will demo proper activation of deep lower abdominals in standing with good alignment holding 8lb ankle weight on shoulder to simulate holding newborn.    Status Achieved      PT SHORT TERM GOAL #3   Title Pt will be educated on propping and positioning options for nursing her newborn.    Status Achieved               PT  Long Term Goals - 12/24/20 0807       PT LONG TERM GOAL #1   Title Pt will be ind with advanced HEP and understand how to safely progress    Time 8    Status On-going      PT LONG TERM GOAL #2   Title Pt will demo proper body mechanics and core use with bend, lift and carry 15lb at midline to simulate newborn care techniques.    Status On-going      PT LONG TERM GOAL #3   Title Pt will report at least 60% reduction in pain with daily tasks related to caring for newborn.    Baseline 30%    Time 8    Period Weeks    Status On-going    Target Date 02/18/21      PT LONG TERM GOAL #4   Title Pt will improve FOTO score from 72% to at least 79% to demo improved function.    Baseline 80%    Time 8    Status Achieved      PT LONG TERM GOAL #5   Title Pt will achieve symmetrical posture and tone across thoracic spine through stretching and stabilization to reduce pain.    Status On-going                   Plan - 12/31/20 0927     Clinical Impression Statement Pt continues to have some thoracic pain related to muscle spasm and weakness with newborn care.  She hasn't had as much time to commit to her HEP but tries to sneak in some stretching as able.  She demos good abdominal wall closure and activation with core stabilization and was able to progress to more challenging ther ex today such as side plank on knee/elbow with clam.  She does have ongoing increased tone in Rt thoracic paraspinals but this is localizing more to approx T8-T12.  She reports pubic bone pain and has not been able to have intercourse due  to this, so PT will assess pelvic ring stability next visit.    Comorbidities open heart surgery when younger    PT Frequency 1x / week    PT Duration 8 weeks    PT Treatment/Interventions ADLs/Self Care Home Management;Moist Heat;Electrical Stimulation;Functional mobility training;Therapeutic activities;Therapeutic exercise;Neuromuscular re-education;Manual  techniques;Patient/family education;Dry needling;Joint Manipulations;Spinal Manipulations;Passive range of motion    PT Next Visit Plan reassess pelvic girdle/ring stability, continue lumbopelvic hip strength and spine ROM/stretching, Rt thoracic paraspinal STM    PT Home Exercise Plan Access Code: [email or web address]    Consulted and Agree with Plan of Care Patient             Patient will benefit from skilled therapeutic intervention in order to improve the following deficits and impairments:     Visit Diagnosis: Chronic midline low back pain without sciatica  Pain in thoracic spine  Muscle weakness (generalized)  Cramp and spasm     Problem List Patient Active Problem List   Diagnosis Date Noted   Normal labor and delivery 09/30/2020   SVD (spontaneous vaginal delivery) 09/30/2020   Normal postpartum course 09/30/2020   Adjustment disorder with anxious mood 06/08/2020   Palpitations 05/05/2014   Residual ASD (atrial septal defect) following repair 05/05/2014    Morton Peters, PT 12/31/20 12:19 PM   [facility] [address], [address] [place], [place], [address] Phone: [phone]   Fax:  [phone]  Name: DONETTE MAINWARING MRN: [id number] Date of Birth: [birth year]

## 2021-01-04 ENCOUNTER — Other Ambulatory Visit: Payer: Self-pay

## 2021-01-04 ENCOUNTER — Encounter: Payer: Self-pay | Admitting: Cardiovascular Disease

## 2021-01-04 ENCOUNTER — Ambulatory Visit (INDEPENDENT_AMBULATORY_CARE_PROVIDER_SITE_OTHER): Payer: PRIVATE HEALTH INSURANCE | Admitting: Cardiovascular Disease

## 2021-01-04 VITALS — BP 108/60 | HR 67 | Ht 62.0 in | Wt 116.0 lb

## 2021-01-04 DIAGNOSIS — R0789 Other chest pain: Secondary | ICD-10-CM | POA: Diagnosis not present

## 2021-01-04 DIAGNOSIS — R002 Palpitations: Secondary | ICD-10-CM

## 2021-01-04 DIAGNOSIS — R0600 Dyspnea, unspecified: Secondary | ICD-10-CM | POA: Diagnosis not present

## 2021-01-04 DIAGNOSIS — Q211 Atrial septal defect, unspecified: Secondary | ICD-10-CM

## 2021-01-04 DIAGNOSIS — R0609 Other forms of dyspnea: Secondary | ICD-10-CM | POA: Insufficient documentation

## 2021-01-04 NOTE — Assessment & Plan Note (Signed)
Latunya complained of dyspnea while pregnant which has since resolved since giving birth.

## 2021-01-04 NOTE — Assessment & Plan Note (Signed)
Kayla Everett has noticed some pain that begins under her right breast and radiates to her sternum.  Principally while she is lying on her back and on her right side and is positional.  This does not sound ischemic but more likely musculoskeletal and/or related to breast-feeding and/or her breast tissue.

## 2021-01-04 NOTE — Progress Notes (Signed)
01/04/2021 Therisa Doyne Brandner   13-Apr-1991  [id number]  Primary Physician Nche, Bonna Gains, NP Primary Cardiologist: Runell Gess MD Nicholes Calamity, [place]   HPI:  Kayla Everett is a 30 y.o.  thin and fit-appearing married Caucasian female, mother of one 60-month-old child, who is self-referred initially for evaluation of palpitations. Her father is Melvenia Beam, a patient of mine as well. Her primary care physician is Dr. Maurice Small. She works as a Air traffic controller. She actually got a four-year degree in [place] and equestrian science. She has no other medical problems. She is on no medications. She noticed dyspnea on exertion during her freshman year in college and went to see her school nurse who auscultated a murmur which led to a 2-D echo revealing an ASD. Ultimately this underwent surgical closure at [facility] with a pericardial patch 12/08/10. She has done well since. She has noticed new onset palpitations over 6 months ago lasting up to one hour time. She drinks only a cup of caffeine a day and does not use other stimulants. There are no other associated symptoms. Her dyspnea has resolved since her surgery.   She moved back to [place].  I did do a 2D echo on her 05/12/2014 that showed no interatrial shunt by bubble study and normal LV function with moderate left atrial enlargement.  She is currently working as a Energy manager.  When I saw her back in October she was complaining of some dyspnea and palpitations most likely pregnancy related.  These have since resolved since giving birth.  I did a 2D echo 08/11/2020 that showed normal LV systolic function, normal valvular function and no evidence of residual ASD.  Since giving birth she is noticed some unusual pain beginning under her right breast primarily at night when lying on her right side radiating to her sternum which is positional and resolves spontaneously when changing  positions.  I reassured her that this was not cardiovascular nature.  Current Meds  Medication Sig   butalbital-acetaminophen-caffeine (FIORICET) 50-325-40 MG tablet Take 1 tablet by mouth every 4 (four) hours as needed for headache.   Cholecalciferol (VITAMIN D) 50 MCG (2000 UT) tablet Take 2,000 Units by mouth daily.   diphenhydramine-acetaminophen (TYLENOL PM) 25-500 MG TABS tablet Take 1 tablet by mouth at bedtime.   fluticasone (FLONASE) 50 MCG/ACT nasal spray Place 1 spray into both nostrils daily.   ibuprofen (ADVIL) 600 MG tablet Take 1 tablet (600 mg total) by mouth every 6 (six) hours.   magnesium oxide (MAG-OX) 400 MG tablet Take 400 mg by mouth daily.   Prenatal Vit-Fe Fumarate-FA (PRENATAL MULTIVITAMIN) TABS tablet Take 1 tablet by mouth daily at 12 noon.   [DISCONTINUED] Evening Primrose Oil 1000 MG CAPS Take 1-2 capsules by mouth in the morning and at bedtime. Takes 1 capsule orally twice daily and places one capsule vaginally at bedtime.   [DISCONTINUED] omega-3 acid ethyl esters (LOVAZA) 1 g capsule Take 1 g by mouth at bedtime.     No Known Allergies  Social History   Socioeconomic History   Marital status: Married    Spouse name: Not on file   Number of children: Not on file   Years of education: Not on file   Highest education level: Not on file  Occupational History   Not on file  Tobacco Use   Smoking status: Never   Smokeless tobacco: Never  Vaping Use   Vaping Use:  Former  Substance and Sexual Activity   Alcohol use: No    Alcohol/week: 0.0 standard drinks   Drug use: No   Sexual activity: Not on file  Other Topics Concern   Not on file  Social History Narrative   Not on file   Social Determinants of Health   Financial Resource Strain: Not on file  Food Insecurity: Not on file  Transportation Needs: Not on file  Physical Activity: Not on file  Stress: Not on file  Social Connections: Not on file  Intimate Partner Violence: Not on file      Review of Systems: General: negative for chills, fever, night sweats or weight changes.  Cardiovascular: negative for chest pain, dyspnea on exertion, edema, orthopnea, palpitations, paroxysmal nocturnal dyspnea or shortness of breath Dermatological: negative for rash Respiratory: negative for cough or wheezing Urologic: negative for hematuria Abdominal: negative for nausea, vomiting, diarrhea, bright red blood per rectum, melena, or hematemesis Neurologic: negative for visual changes, syncope, or dizziness All other systems reviewed and are otherwise negative except as noted above.    Blood pressure 108/60, pulse 67, height 5\' 2"  (1.575 m), weight 116 lb (52.6 kg), unknown if currently breastfeeding.  General appearance: alert and no distress Neck: no adenopathy, no carotid bruit, no JVD, supple, symmetrical, trachea midline, and thyroid not enlarged, symmetric, no tenderness/mass/nodules Lungs: clear to auscultation bilaterally Heart: regular rate and rhythm, S1, S2 normal, no murmur, click, rub or gallop Extremities: extremities normal, atraumatic, no cyanosis or edema Pulses: 2+ and symmetric Skin: Skin color, texture, turgor normal. No rashes or lesions Neurologic: Grossly normal  EKG sinus rhythm at 67 with nonspecific ST and T wave changes and right axis deviation.  I personally reviewed this EKG.  ASSESSMENT AND PLAN:   Palpitations History of palpitations primarily during her pregnancy which have since resolved since giving birth.  Residual ASD (atrial septal defect) following repair History of surgical ASD repair 12/08/2010 at [facility] in [facility].  Her most recent 2D echocardiogram performed 08/11/2020 revealed normal LV systolic function with no residual atrial septal defect.  Atypical chest pain Kayla Everett has noticed some pain that begins under her right breast and radiates to her sternum.  Principally while she is lying on her back and on her right side  and is positional.  This does not sound ischemic but more likely musculoskeletal and/or related to breast-feeding and/or her breast tissue.  Dyspnea on exertion Trust complained of dyspnea while pregnant which has since resolved since giving birth.     Kayla Ridgel MD FACP,FACC,FAHA, [facility] 01/04/2021 3:25 PM

## 2021-01-04 NOTE — Patient Instructions (Signed)
Medication Instructions:  No Changes In Medications at this time.  *If you need a refill on your cardiac medications before your next appointment, please call your pharmacy*  Follow-Up: At [facility], you and your health needs are our priority.  As part of our continuing mission to provide you with exceptional heart care, we have created designated Provider Care Teams.  These Care Teams include your primary Cardiologist (physician) and Advanced Practice Providers (APPs -  Physician Assistants and Nurse Practitioners) who all work together to provide you with the care you need, when you need it.  Your next appointment:   1 year(s)  The format for your next appointment:   In Person  Provider:   Jonathan Berry, MD  

## 2021-01-04 NOTE — Assessment & Plan Note (Signed)
History of surgical ASD repair 12/08/2010 at [facility] in [place].  Her most recent 2D echocardiogram performed 08/11/2020 revealed normal LV systolic function with no residual atrial septal defect.

## 2021-01-04 NOTE — Assessment & Plan Note (Signed)
History of palpitations primarily during her pregnancy which have since resolved since giving birth.

## 2021-01-10 ENCOUNTER — Telehealth: Payer: Self-pay

## 2021-01-10 NOTE — Telephone Encounter (Signed)
Patient called requesting TOC from Maryjo Rochester, NP to Ria Clock, FNP.  Please advise.

## 2021-01-13 ENCOUNTER — Ambulatory Visit
Admission: EM | Admit: 2021-01-13 | Discharge: 2021-01-13 | Disposition: A | Discharge status: Home / Self Care | Payer: PRIVATE HEALTH INSURANCE | Attending: Emergency Medicine | Admitting: Emergency Medicine

## 2021-01-13 ENCOUNTER — Encounter: Payer: Self-pay | Admitting: Emergency Medicine

## 2021-01-13 ENCOUNTER — Other Ambulatory Visit: Payer: Self-pay

## 2021-01-13 DIAGNOSIS — L03031 Cellulitis of right toe: Secondary | ICD-10-CM | POA: Diagnosis not present

## 2021-01-13 MED ORDER — MUPIROCIN 2 % EX OINT: 1.0000 "application " | TOPICAL_OINTMENT | Freq: Two times a day (BID) | CUTANEOUS | 0 refills | Status: DC

## 2021-01-13 MED ORDER — CEPHALEXIN 500 MG PO CAPS: 500.0000 mg | ORAL_CAPSULE | Freq: Four times a day (QID) | ORAL | 0 refills | Status: AC

## 2021-01-13 NOTE — Discharge Instructions (Addendum)
Keflex 4 times daily x5 days Continue to soak in warm water for approximately 15 minutes twice daily, dry well and apply Bactroban ointment Tylenol/ibuprofen as needed for pain Elevate foot Follow-up if not improving or worsening

## 2021-01-13 NOTE — ED Provider Notes (Signed)
[facility]    CSN: [id number] Arrival date & time: 01/13/21  1232      History   Chief Complaint Chief Complaint  Patient presents with   Foot Pain    HPI Kayla Everett is a 30 y.o. female currently breast-feeding presenting today for evaluation of right great toe redness and swelling.  Reports over the past few days she has developed increased redness and swelling around her right great toe.  Reports associated drainage.  Applying antibiotic ointment.  Reports history of toenail often breaking and splitting towards this nail fold.  HPI  Past Medical History:  Diagnosis Date   Anxiety    Depression    Heart murmur    Palpitations    Residual ASD (atrial septal defect) following repair june 2012   [facility]    Patient Active Problem List   Diagnosis Date Noted   Atypical chest pain 01/04/2021   Dyspnea on exertion 01/04/2021   Normal labor and delivery 09/30/2020   SVD (spontaneous vaginal delivery) 09/30/2020   Normal postpartum course 09/30/2020   Adjustment disorder with anxious mood 06/08/2020   Palpitations 05/05/2014   Residual ASD (atrial septal defect) following repair 05/05/2014    Past Surgical History:  Procedure Laterality Date   ASD REPAIR      OB History     Gravida  1   Para  1   Term  1   Preterm      AB      Living  1      SAB      IAB      Ectopic      Multiple  0   Live Births  1            Home Medications    Prior to Admission medications   Medication Sig Start Date End Date Taking? Authorizing Provider  cephALEXin (KEFLEX) 500 MG capsule Take 1 capsule (500 mg total) by mouth 4 (four) times daily for 5 days. 01/13/21 01/18/21 Yes Phelicia Dantes C, PA-C  mupirocin ointment (BACTROBAN) 2 % Apply 1 application topically 2 (two) times daily. 01/13/21  Yes Jayle Solarz C, PA-C  butalbital-acetaminophen-caffeine (FIORICET) 50-325-40 MG tablet Take 1 tablet by mouth every 4 (four) hours as needed  for headache. 07/12/20   [provider]  Cholecalciferol (VITAMIN D) 50 MCG (2000 UT) tablet Take 2,000 Units by mouth daily.    [provider]  diphenhydramine-acetaminophen (TYLENOL PM) 25-500 MG TABS tablet Take 1 tablet by mouth at bedtime.    [provider]  fluticasone (FLONASE) 50 MCG/ACT nasal spray Place 1 spray into both nostrils daily.    [provider]  ibuprofen (ADVIL) 600 MG tablet Take 1 tablet (600 mg total) by mouth every 6 (six) hours. 10/02/20   Roma Schanz, CNM  magnesium oxide (MAG-OX) 400 MG tablet Take 400 mg by mouth daily.    [provider]  Prenatal Vit-Fe Fumarate-FA (PRENATAL MULTIVITAMIN) TABS tablet Take 1 tablet by mouth daily at 12 noon.    [provider]    Family History Family History  Problem Relation Age of Onset   Hyperlipidemia Father    Heart disease Father    Hyperlipidemia Paternal Grandfather    Heart disease Paternal Grandfather     Social History Social History   Tobacco Use   Smoking status: Never   Smokeless tobacco: Never  Vaping Use   Vaping Use: Former  Substance Use Topics  Alcohol use: No    Alcohol/week: 0.0 standard drinks   Drug use: No     Allergies   Patient has no known allergies.   Review of Systems Review of Systems  Constitutional:  Negative for fatigue and fever.  Eyes:  Negative for visual disturbance.  Respiratory:  Negative for shortness of breath.   Cardiovascular:  Negative for chest pain.  Gastrointestinal:  Negative for abdominal pain, nausea and vomiting.  Musculoskeletal:  Negative for arthralgias and joint swelling.  Skin:  Positive for color change. Negative for rash and wound.  Neurological:  Negative for dizziness, weakness, light-headedness and headaches.    Physical Exam Triage Vital Signs ED Triage Vitals [01/13/21 1308]  Enc Vitals Group     BP 116/73     Pulse Rate 60     Resp 18     Temp 97.9 F (36.6 C)     Temp src       SpO2 98 %     Weight      Height      Head Circumference      Peak Flow      Pain Score 3     Pain Loc      Pain Edu?      Excl. in GC?    No data found.  Updated Vital Signs BP 116/73 (BP Location: Right Arm)   Pulse 60   Temp 97.9 F (36.6 C)   Resp 18   SpO2 98%   Breastfeeding Yes   Visual Acuity Right Eye Distance:   Left Eye Distance:   Bilateral Distance:    Right Eye Near:   Left Eye Near:    Bilateral Near:     Physical Exam Vitals and nursing note reviewed.  Constitutional:      Appearance: She is well-developed.     Comments: No acute distress  HENT:     Head: Normocephalic and atraumatic.     Nose: Nose normal.  Eyes:     Conjunctiva/sclera: Conjunctivae normal.  Cardiovascular:     Rate and Rhythm: Normal rate.  Pulmonary:     Effort: Pulmonary effort is normal. No respiratory distress.  Abdominal:     General: There is no distension.  Musculoskeletal:        General: Normal range of motion.     Cervical back: Neck supple.  Skin:    General: Skin is warm and dry.     Comments: Medial nail fold of right great toe with erythema and swelling, purulent drainage noted  Neurological:     Mental Status: She is alert and oriented to person, place, and time.     UC Treatments / Results  Labs (all labs ordered are listed, but only abnormal results are displayed) Labs Reviewed - No data to display  EKG   Radiology No results found.  Procedures Procedures (including critical care time)  Medications Ordered in UC Medications - No data to display  Initial Impression / Assessment and Plan / UC Course  I have reviewed the triage vital signs and the nursing notes.  Pertinent labs & imaging results that were available during my care of the patient were reviewed by me and considered in my medical decision making (see chart for details).     Right great toe paronychia-treating with Keflex and Bactroban topically, elevated warm  compresses.  Discussed strict return precautions. Patient verbalized understanding and is agreeable with plan.  Final Clinical Impressions(s) / UC Diagnoses   Final  diagnoses:  Paronychia of great toe of right foot     Discharge Instructions      Keflex 4 times daily x5 days Continue to soak in warm water for approximately 15 minutes twice daily, dry well and apply Bactroban ointment Tylenol/ibuprofen as needed for pain Elevate foot Follow-up if not improving or worsening     ED Prescriptions     Medication Sig Dispense Auth. Provider   cephALEXin (KEFLEX) 500 MG capsule Take 1 capsule (500 mg total) by mouth 4 (four) times daily for 5 days. 20 capsule Saran Laviolette C, PA-C   mupirocin ointment (BACTROBAN) 2 % Apply 1 application topically 2 (two) times daily. 30 g Stevenson Windmiller, Rolla C, PA-C      PDMP not reviewed this encounter.   Lew Dawes, PA-C 01/13/21 1404

## 2021-01-13 NOTE — ED Triage Notes (Signed)
Patiet presents tro [facility] for evaluation of right great toe pain after a split nail broke off over the weekend.  Pain and redness and skin sloughing starting the next day.  Concerned for infection-

## 2021-01-14 ENCOUNTER — Encounter: Payer: Self-pay | Admitting: Physical Therapy

## 2021-01-14 ENCOUNTER — Ambulatory Visit: Payer: PRIVATE HEALTH INSURANCE | Admitting: Physical Therapy

## 2021-01-14 DIAGNOSIS — G8929 Other chronic pain: Secondary | ICD-10-CM

## 2021-01-14 DIAGNOSIS — M546 Pain in thoracic spine: Secondary | ICD-10-CM

## 2021-01-14 DIAGNOSIS — M6281 Muscle weakness (generalized): Secondary | ICD-10-CM

## 2021-01-14 DIAGNOSIS — M545 Low back pain, unspecified: Secondary | ICD-10-CM | POA: Diagnosis not present

## 2021-01-14 NOTE — Therapy (Signed)
[facility] [address] [place], [place], [address] Phone: [phone]   Fax:  [phone]  Physical Therapy Treatment  Patient Details  Name: Kayla Everett MRN: [id number] Date of Birth: 1991/02/29 Referring Provider (PT): Nigel Bridgeman, [place]   Encounter Date: 01/14/2021   PT End of Session - 01/14/21 0852     Visit Number 7    Date for PT Re-Evaluation 02/18/21    Authorization Type Preferred one    Authorization Time Period through 07/02/21    Authorization - Visit Number 7    PT Start Time 0850    PT Stop Time 0930    PT Time Calculation (min) 40 min    Activity Tolerance Patient tolerated treatment well    Behavior During Therapy [facility] for tasks assessed/performed             Past Medical History:  Diagnosis Date   Anxiety    Depression    Heart murmur    Palpitations    Residual ASD (atrial septal defect) following repair june 2012   [facility]    Past Surgical History:  Procedure Laterality Date   ASD REPAIR      There were no vitals filed for this visit.   Subjective Assessment - 01/14/21 0850     Subjective Pt reports she is feeling better - sitting is more tolerated and she and her husband has been able to have intercourse with min pain.  Pubic bone can get sore in evening after more activity.    Pertinent History lactating, had baby 09/30/20, ASD repair, scoliosis    Patient Stated Goals loosen up back    Currently in Pain? Yes    Pain Score 0-No pain                [facility] PT Assessment - 01/14/21 0001       Palpation   SI assessment  Rt sacral rotation with hypomobility on Rt, limited Rt SI joint glide, tender sup/inf aspects of pubic bone    Palpation comment tender: bil prox adductors, Rt coccygeus and piriformis                           [facility] Adult PT Treatment/Exercise - 01/14/21 0001       Self-Care   Self-Care Other Self-Care Comments     Other Self-Care Comments  pressure release ball to Rt coccygeus and piriformis, cupping myofascial dragging pubic bone to bladder Rt/Lt, continue butterfly stretch, Rt hip flexor stretch      Manual Therapy   Manual Therapy Joint mobilization;Soft tissue mobilization    Joint Mobilization Rt sacral border PA for Lt rotation Gr II/III, UPAs Rt T12-L3    Soft tissue mobilization Rt coccygeus and piriformis                      PT Short Term Goals - 12/24/20 0804       PT SHORT TERM GOAL #1   Title Pt will be ind with initial HEP    Status Achieved      PT SHORT TERM GOAL #2   Title Pt will demo proper activation of deep lower abdominals in standing with good alignment holding 8lb ankle weight on shoulder to simulate holding newborn.    Status Achieved      PT SHORT TERM GOAL #3   Title Pt will be educated on propping and positioning  options for nursing her newborn.    Status Achieved               PT Long Term Goals - 12/24/20 0807       PT LONG TERM GOAL #1   Title Pt will be ind with advanced HEP and understand how to safely progress    Time 8    Status On-going      PT LONG TERM GOAL #2   Title Pt will demo proper body mechanics and core use with bend, lift and carry 15lb at midline to simulate newborn care techniques.    Status On-going      PT LONG TERM GOAL #3   Title Pt will report at least 60% reduction in pain with daily tasks related to caring for newborn.    Baseline 30%    Time 8    Period Weeks    Status On-going    Target Date 02/18/21      PT LONG TERM GOAL #4   Title Pt will improve FOTO score from 72% to at least 79% to demo improved function.    Baseline 80%    Time 8    Status Achieved      PT LONG TERM GOAL #5   Title Pt will achieve symmetrical posture and tone across thoracic spine through stretching and stabilization to reduce pain.    Status On-going                   Plan - 01/14/21 1222     Clinical  Impression Statement Pt reports overall reduction in pain with improved tolerance of all positions and newborn care.  She has been able to have intercourse 2x with much less pubic bone pain since last visit.  She has limited Rt SI joint mobility and soft tissue restrictions with TPs in coccygeus and piriformis on Rt.  Rt hip flexor and bil adductors are tight and tender.  PT noted some fascial restrictions Rt>Lt surrounding bladder.  PT educated Pt on self-release techniques using massage, myofascial suction cup and pressure release ball to complement HEP stretches.  Pt will continue to benefit from skilled PT along POC with ongoing assessment.    Comorbidities open heart surgery when younger    PT Frequency 1x / week    PT Duration 8 weeks    PT Treatment/Interventions ADLs/Self Care Home Management;Moist Heat;Electrical Stimulation;Functional mobility training;Therapeutic activities;Therapeutic exercise;Neuromuscular re-education;Manual techniques;Patient/family education;Dry needling;Joint Manipulations;Spinal Manipulations;Passive range of motion    PT Next Visit Plan f/u on HEP and self-care from last visit, STM and release to Rt piriformis and coccygeus, sacral mobs, hip and core strength    PT Home Exercise Plan Access Code: [email or web address] plus: Hip flexor stretching and adductor stretching (butterfly)  Self-massage above and below pubic bone  Gentle fascial massage from pubic bone upwards and skewed to the diagonals around your bladder  Deep pressure ball into Rt coccygeus (sitting?) and piriformis (sidelying/on your back - kind of in between these positions)  Cupping fascial release around bladder    Consulted and Agree with Plan of Care Patient             Patient will benefit from skilled therapeutic intervention in order to improve the following deficits and impairments:     Visit Diagnosis: Chronic midline low back pain without sciatica  Pain in thoracic spine  Muscle weakness  (generalized)     Problem List Patient Active Problem List   Diagnosis Date Noted  Atypical chest pain 01/04/2021   Dyspnea on exertion 01/04/2021   Normal labor and delivery 09/30/2020   SVD (spontaneous vaginal delivery) 09/30/2020   Normal postpartum course 09/30/2020   Adjustment disorder with anxious mood 06/08/2020   Palpitations 05/05/2014   Residual ASD (atrial septal defect) following repair 05/05/2014    Morton Peters, PT 01/14/21 12:37 PM   [facility] [address], [address] [place], [place], [address] Phone: [phone]   Fax:  [phone]  Name: ROZETTA STUMPP MRN: [id number] Date of Birth: [birth year]

## 2021-01-14 NOTE — Patient Instructions (Signed)
Today we assessed your pelvic ring and found the following ideas to help you:  Hip flexor stretching and adductor stretching (butterfly) Self-massage above and below pubic bone Gentle fascial massage from pubic bone upwards and skewed to the diagonals around your bladder Deep pressure ball into Rt coccygeus (sitting?) and piriformis (sidelying/on your back - kind of in between these positions) Cupping fascial release around bladder

## 2021-01-21 ENCOUNTER — Ambulatory Visit: Payer: PRIVATE HEALTH INSURANCE | Admitting: Physical Therapy

## 2021-01-24 ENCOUNTER — Encounter: Payer: Self-pay | Admitting: Physical Therapy

## 2021-01-24 ENCOUNTER — Other Ambulatory Visit: Payer: Self-pay

## 2021-01-24 ENCOUNTER — Ambulatory Visit: Payer: PRIVATE HEALTH INSURANCE | Admitting: Physical Therapy

## 2021-01-24 DIAGNOSIS — R252 Cramp and spasm: Secondary | ICD-10-CM

## 2021-01-24 DIAGNOSIS — M6281 Muscle weakness (generalized): Secondary | ICD-10-CM

## 2021-01-24 DIAGNOSIS — M546 Pain in thoracic spine: Secondary | ICD-10-CM

## 2021-01-24 DIAGNOSIS — M545 Low back pain, unspecified: Secondary | ICD-10-CM | POA: Diagnosis not present

## 2021-01-24 DIAGNOSIS — G8929 Other chronic pain: Secondary | ICD-10-CM

## 2021-01-24 NOTE — Therapy (Signed)
[facility] [address], [address] [place], [place], [address] Phone: [phone]   Fax:  [phone]  Physical Therapy Treatment  Patient Details  Name: Kayla Everett MRN: [id number] Date of Birth: [birth year] Referring Provider (PT): Nigel Bridgeman, [place]   Encounter Date: 01/24/2021   PT End of Session - 01/24/21 0932     Visit Number 8    Date for PT Re-Evaluation 02/18/21    Authorization Type Preferred one    Authorization Time Period through 07/02/21    Authorization - Visit Number 8    PT Start Time 0847    PT Stop Time 0930    PT Time Calculation (min) 43 min    Activity Tolerance Patient tolerated treatment well    Behavior During Therapy [facility] for tasks assessed/performed             Past Medical History:  Diagnosis Date   Anxiety    Depression    Heart murmur    Palpitations    Residual ASD (atrial septal defect) following repair june 2012   [facility]    Past Surgical History:  Procedure Laterality Date   ASD REPAIR      There were no vitals filed for this visit.   Subjective Assessment - 01/24/21 0851     Subjective I've had a lot of pain lately that started mid-last week.  Was more active and have tried stretching/foam rolling.    Pertinent History lactating, had baby 09/30/20, ASD repair, scoliosis    Limitations Other (comment);Standing;Sitting    Patient Stated Goals loosen up back    Currently in Pain? Yes    Pain Score 4     Pain Location Back    Pain Orientation Right    Pain Descriptors / Indicators Tightness;Spasm;Sharp    Pain Type Chronic pain    Pain Onset More than a month ago    Pain Frequency Intermittent    Aggravating Factors  movement, positions in sitting                               [facility] Adult PT Treatment/Exercise - 01/24/21 0001       Self-Care   Self-Care Other Self-Care Comments    Other Self-Care Comments  verbal review of HEP  targets with squat to march progression from basic squat, trunk stabilization in neutral with overlay of UE dumbbell/bands      Exercises   Exercises Knee/Hip      Lumbar Exercises: Supine   Bridge 10 reps    Bridge Limitations frog bridge      Lumbar Exercises: Sidelying   Clam Right;10 reps    Hip Abduction Right;5 reps      Manual Therapy   Manual Therapy Soft tissue mobilization    Soft tissue mobilization bil thoracic paraspinals, Rt glut med                      PT Short Term Goals - 12/24/20 0804       PT SHORT TERM GOAL #1   Title Pt will be ind with initial HEP    Status Achieved      PT SHORT TERM GOAL #2   Title Pt will demo proper activation of deep lower abdominals in standing with good alignment holding 8lb ankle weight on shoulder to simulate holding newborn.    Status Achieved  PT SHORT TERM GOAL #3   Title Pt will be educated on propping and positioning options for nursing her newborn.    Status Achieved               PT Long Term Goals - 12/24/20 0807       PT LONG TERM GOAL #1   Title Pt will be ind with advanced HEP and understand how to safely progress    Time 8    Status On-going      PT LONG TERM GOAL #2   Title Pt will demo proper body mechanics and core use with bend, lift and carry 15lb at midline to simulate newborn care techniques.    Status On-going      PT LONG TERM GOAL #3   Title Pt will report at least 60% reduction in pain with daily tasks related to caring for newborn.    Baseline 30%    Time 8    Period Weeks    Status On-going    Target Date 02/18/21      PT LONG TERM GOAL #4   Title Pt will improve FOTO score from 72% to at least 79% to demo improved function.    Baseline 80%    Time 8    Status Achieved      PT LONG TERM GOAL #5   Title Pt will achieve symmetrical posture and tone across thoracic spine through stretching and stabilization to reduce pain.    Status On-going                    Plan - 01/24/21 0932     Clinical Impression Statement Pt with report of improving Rt hip tenderness with use of foam roller since last visit.  PT noted reduced TP in coccygeus and piriformis but ongoing Rt glut med TP with tenderness.  She has had increased pain in Rt>Lt thoracic paraspinals with combined movement patterns.  STM used to reduce tone and pain today.  PT progressed HEP to include squat to march for Rt glut strength/bulk/stabilization, frog bridge, and SL hip clam and abd as tol. Pt is quick to fatigue with this Rt>Lt.  Pt working with Advertising copywriter and gynecologist about her milk supply and management of weight with supplements given high caloric output with nursing her nearly 13 month old.  Continue PT along POC.    Comorbidities open heart surgery when younger    PT Frequency 1x / week    PT Duration 8 weeks    PT Treatment/Interventions ADLs/Self Care Home Management;Moist Heat;Electrical Stimulation;Functional mobility training;Therapeutic activities;Therapeutic exercise;Neuromuscular re-education;Manual techniques;Patient/family education;Dry needling;Joint Manipulations;Spinal Manipulations;Passive range of motion    PT Next Visit Plan review frog bridge, SL clam and hip abd, squat to march, trunk stabilization in neutral with overlay of UE/LE    PT Home Exercise Plan Access Code: [email or web address] plus: Hip flexor stretching and adductor stretching (butterfly)  Self-massage above and below pubic bone  Gentle fascial massage from pubic bone upwards and skewed to the diagonals around your bladder  Deep pressure ball into Rt coccygeus (sitting?) and piriformis (sidelying/on your back - kind of in between these positions)  Cupping fascial release around bladder    Consulted and Agree with Plan of Care Patient             Patient will benefit from skilled therapeutic intervention in order to improve the following deficits and impairments:     Visit  Diagnosis: Chronic midline low back  pain without sciatica  Pain in thoracic spine  Muscle weakness (generalized)  Cramp and spasm     Problem List Patient Active Problem List   Diagnosis Date Noted   Atypical chest pain 01/04/2021   Dyspnea on exertion 01/04/2021   Normal labor and delivery 09/30/2020   SVD (spontaneous vaginal delivery) 09/30/2020   Normal postpartum course 09/30/2020   Adjustment disorder with anxious mood 06/08/2020   Palpitations 05/05/2014   Residual ASD (atrial septal defect) following repair 05/05/2014   Morton Peters, PT 01/24/21 12:37 PM   [facility] [address] [place], [place], [address] Phone: [phone]   Fax:  [phone]  Name: Kayla Everett MRN: [id number] Date of Birth: [birth year]

## 2021-01-25 ENCOUNTER — Telehealth: Payer: Self-pay

## 2021-01-25 NOTE — Telephone Encounter (Signed)
Is TOC ok, please advised.

## 2021-01-28 ENCOUNTER — Encounter: Payer: PRIVATE HEALTH INSURANCE | Admitting: Physical Therapy

## 2021-02-04 ENCOUNTER — Ambulatory Visit: Payer: PRIVATE HEALTH INSURANCE | Admitting: Physical Therapy

## 2021-02-08 ENCOUNTER — Encounter: Payer: PRIVATE HEALTH INSURANCE | Admitting: Physical Therapy

## 2021-02-08 NOTE — Telephone Encounter (Signed)
error 

## 2021-02-11 ENCOUNTER — Encounter: Payer: PRIVATE HEALTH INSURANCE | Admitting: Physical Therapy

## 2021-02-11 ENCOUNTER — Ambulatory Visit: Payer: PRIVATE HEALTH INSURANCE | Attending: Obstetrics and Gynecology | Admitting: Physical Therapy

## 2021-02-11 ENCOUNTER — Other Ambulatory Visit: Payer: Self-pay

## 2021-02-11 ENCOUNTER — Encounter: Payer: Self-pay | Admitting: Physical Therapy

## 2021-02-11 DIAGNOSIS — M6281 Muscle weakness (generalized): Secondary | ICD-10-CM | POA: Diagnosis present

## 2021-02-11 DIAGNOSIS — R252 Cramp and spasm: Secondary | ICD-10-CM | POA: Insufficient documentation

## 2021-02-11 DIAGNOSIS — R2689 Other abnormalities of gait and mobility: Secondary | ICD-10-CM | POA: Insufficient documentation

## 2021-02-11 DIAGNOSIS — M546 Pain in thoracic spine: Secondary | ICD-10-CM | POA: Insufficient documentation

## 2021-02-11 DIAGNOSIS — R279 Unspecified lack of coordination: Secondary | ICD-10-CM | POA: Insufficient documentation

## 2021-02-11 NOTE — Therapy (Signed)
[facility] [address], [address] [place], [place], [address] Phone: [phone]   Fax:  [phone]  Physical Therapy Treatment  Patient Details  Name: Kayla Everett MRN: [id number] Date of Birth: Mar 08, 1991 Referring Provider (PT): Nigel Bridgeman, [place]   Encounter Date: 02/11/2021   PT End of Session - 02/11/21 1155     Visit Number 9    Date for PT Re-Evaluation 02/18/21    Authorization Type Preferred one    Authorization Time Period through 07/02/21    Authorization - Visit Number 9    PT Start Time 0850    PT Stop Time 0930    PT Time Calculation (min) 40 min    Activity Tolerance Patient tolerated treatment well    Behavior During Therapy [facility] for tasks assessed/performed             Past Medical History:  Diagnosis Date   Anxiety    Depression    Heart murmur    Palpitations    Residual ASD (atrial septal defect) following repair june 2012   [facility]    Past Surgical History:  Procedure Laterality Date   ASD REPAIR      There were no vitals filed for this visit.   Subjective Assessment - 02/11/21 0852     Subjective I am getting frustrated.  I am about the same.  My muscles around my ribcage are so tight.  Stretching does not help.  I have been having increased muscle spasms.    Pertinent History lactating, had baby 09/30/20, ASD repair, scoliosis    Patient Stated Goals loosen up back    Currently in Pain? Yes    Pain Score 3     Pain Location Back    Pain Orientation Mid    Pain Descriptors / Indicators Tightness;Aching    Pain Type Chronic pain    Pain Radiating Towards ribcage    Pain Onset More than a month ago    Pain Frequency Intermittent                               [facility] Adult PT Treatment/Exercise - 02/11/21 0001       Lumbar Exercises: Stretches   Lower Trunk Rotation 1 rep;10 seconds    Lower Trunk Rotation Limitations bil    Quadruped Mid Back  Stretch 2 reps;10 seconds    Other Lumbar Stretch Exercise child's pose 2x20      Lumbar Exercises: Quadruped   Madcat/Old Horse 5 reps    Other Quadruped Lumbar Exercises sidebending 1x15 sec      Manual Therapy   Manual Therapy Soft tissue mobilization;Myofascial release    Soft tissue mobilization Lt thoracic paraspinal stripping and broadening, Rt obliques, QL and thoracic paraspinals stripping and broadening    Myofascial Release Rt obliques                      PT Short Term Goals - 12/24/20 0804       PT SHORT TERM GOAL #1   Title Pt will be ind with initial HEP    Status Achieved      PT SHORT TERM GOAL #2   Title Pt will demo proper activation of deep lower abdominals in standing with good alignment holding 8lb ankle weight on shoulder to simulate holding newborn.    Status Achieved      PT SHORT TERM  GOAL #3   Title Pt will be educated on propping and positioning options for nursing her newborn.    Status Achieved               PT Long Term Goals - 12/24/20 0807       PT LONG TERM GOAL #1   Title Pt will be ind with advanced HEP and understand how to safely progress    Time 8    Status On-going      PT LONG TERM GOAL #2   Title Pt will demo proper body mechanics and core use with bend, lift and carry 15lb at midline to simulate newborn care techniques.    Status On-going      PT LONG TERM GOAL #3   Title Pt will report at least 60% reduction in pain with daily tasks related to caring for newborn.    Baseline 30%    Time 8    Period Weeks    Status On-going    Target Date 02/18/21      PT LONG TERM GOAL #4   Title Pt will improve FOTO score from 72% to at least 79% to demo improved function.    Baseline 80%    Time 8    Status Achieved      PT LONG TERM GOAL #5   Title Pt will achieve symmetrical posture and tone across thoracic spine through stretching and stabilization to reduce pain.    Status On-going                    Plan - 02/11/21 1156     Clinical Impression Statement Pt arrived frustrated with ongoing tightness and pain around her mid-back and ribcage.  She states she is compliant with spinal stretches but these don't help.  She is doing basic core but PT discussed with her that her paraspinals and obliques are likely substituting for a core that is still weaker than her physical demands require in her post-partum state.  Verbal discussion of introduction of more combined plane and cross body trunk strengthening would be a good next step.  Pt declined to progress ther ex today due to pain and requested manual therapy as this gives her more immediate relief.  PT addressed Rt > Lt tension through [facility] and myofascial work along thoracic paraspinals, obliques and QL today with reduced tension and improved thoracic mobility/pain level end of session.  ERO next visit with d/c to transition Pt to a new POC for pelvic floor PT.    Comorbidities open heart surgery when younger    PT Frequency 1x / week    PT Duration 8 weeks    PT Treatment/Interventions ADLs/Self Care Home Management;Moist Heat;Electrical Stimulation;Functional mobility training;Therapeutic activities;Therapeutic exercise;Neuromuscular re-education;Manual techniques;Patient/family education;Dry needling;Joint Manipulations;Spinal Manipulations;Passive range of motion    PT Next Visit Plan ERO, d/c to new POC for pelvic floor PT    PT Home Exercise Plan Access Code: [email or web address] plus: Hip flexor stretching and adductor stretching (butterfly)  Self-massage above and below pubic bone  Gentle fascial massage from pubic bone upwards and skewed to the diagonals around your bladder  Deep pressure ball into Rt coccygeus (sitting?) and piriformis (sidelying/on your back - kind of in between these positions)  Cupping fascial release around bladder    Consulted and Agree with Plan of Care Patient             Patient will benefit from skilled  therapeutic intervention in order to  improve the following deficits and impairments:     Visit Diagnosis: Pain in thoracic spine  Muscle weakness (generalized)  Cramp and spasm     Problem List Patient Active Problem List   Diagnosis Date Noted   Atypical chest pain 01/04/2021   Dyspnea on exertion 01/04/2021   Normal labor and delivery 09/30/2020   SVD (spontaneous vaginal delivery) 09/30/2020   Normal postpartum course 09/30/2020   Adjustment disorder with anxious mood 06/08/2020   Palpitations 05/05/2014   Residual ASD (atrial septal defect) following repair 05/05/2014   Morton Peters, PT 02/11/21 12:01 PM    [facility] [address], [address] [place], [place], [address] Phone: [phone]   Fax:  [phone]  Name: Kayla Everett MRN: [id number] Date of Birth: 1990/12/17

## 2021-02-18 ENCOUNTER — Ambulatory Visit: Payer: PRIVATE HEALTH INSURANCE | Admitting: Physical Therapy

## 2021-02-18 ENCOUNTER — Encounter: Payer: Self-pay | Admitting: Physical Therapy

## 2021-02-18 ENCOUNTER — Other Ambulatory Visit: Payer: Self-pay

## 2021-02-18 DIAGNOSIS — M6281 Muscle weakness (generalized): Secondary | ICD-10-CM

## 2021-02-18 DIAGNOSIS — M546 Pain in thoracic spine: Secondary | ICD-10-CM | POA: Diagnosis not present

## 2021-02-18 NOTE — Therapy (Signed)
[facility] [address], [address] [place], [place], [address] Phone: [phone]   Fax:  [phone]  Physical Therapy Treatment  Patient Details  Name: Kayla Everett MRN: [id number] Date of Birth: 02/19/1991 Referring Provider (PT): Nigel Bridgeman, [place]   Encounter Date: 02/18/2021   PT End of Session - 02/18/21 0805     Visit Number 10    Date for PT Re-Evaluation 02/18/21    Authorization Type Preferred one    Authorization Time Period through 07/02/21    Authorization - Visit Number 10    PT Start Time 0802    PT Stop Time 0845    PT Time Calculation (min) 43 min    Activity Tolerance Patient tolerated treatment well    Behavior During Therapy [facility] for tasks assessed/performed             Past Medical History:  Diagnosis Date   Anxiety    Depression    Heart murmur    Palpitations    Residual ASD (atrial septal defect) following repair june 2012   [facility]    Past Surgical History:  Procedure Laterality Date   ASD REPAIR      There were no vitals filed for this visit.   Subjective Assessment - 02/18/21 0803     Subjective I am feeling much better than last week.  The massage really helped.  I introduced some cross movement and strengthening and found it helped but I also realize I am so weak.    Pertinent History lactating, had baby 09/30/20, ASD repair, scoliosis    Limitations Other (comment);Standing;Sitting    Patient Stated Goals loosen up back    Currently in Pain? No/denies                [facility] PT Assessment - 02/18/21 0001       Assessment   Medical Diagnosis LBP    Referring Provider (PT) Nigel Bridgeman, CNM    Prior Therapy yes during pregnancy      Observation/Other Assessments   Focus on Therapeutic Outcomes (FOTO)  80%      ROM / Strength   AROM / PROM / Strength AROM;Strength      AROM   Overall AROM Comments WNL trunk ROM without pain today      PROM   Overall  PROM Comments hips WNL      Strength   Overall Strength Comments bil hip IR/ER 4/5, all other hip strength 5/5 bil, single finger diastasis above umbilicus      Flexibility   Soft Tissue Assessment /Muscle Length yes    Hamstrings Rt adductors end range restriction on Rt      Palpation   Spinal mobility tension present in Rt lower thoracic paraspinals, smaller focused area of tension compared to previous visits    SI assessment  WNL mobility, some hypomobility of Rt sacrum    Palpation comment tender pubic joint and adductors Rt>Lt                           [facility] Adult PT Treatment/Exercise - 02/18/21 0001       Exercises   Exercises Other Exercises    Other Exercises  verbal review and practice of mat based stretches, discussion of strength progression      Knee/Hip Exercises: Supine   Bridges Strengthening;Both;10 reps    Bridges Limitations frog bridges (in ER), and with ball squeeze  Knee/Hip Exercises: Sidelying   Clams 10x with red loop band    Other Sidelying Knee/Hip Exercises hip IR with ball between knees, discussed adding loop around ankles when ready      Manual Therapy   Manual Therapy Soft tissue mobilization    Soft tissue mobilization Rt lower thoracic paraspinal stripping and broadening                    PT Education - 02/18/21 0829     Education Details added resistance to IR/ER for hip strength, functional squat with hip abd resistance, discussed rotation and row high to low and bent over row to add for upper body    Person(s) Educated Patient    Methods Explanation;Demonstration    Comprehension Returned demonstration;Verbalized understanding              PT Short Term Goals - 12/24/20 0804       PT SHORT TERM GOAL #1   Title Pt will be ind with initial HEP    Status Achieved      PT SHORT TERM GOAL #2   Title Pt will demo proper activation of deep lower abdominals in standing with good alignment holding 8lb  ankle weight on shoulder to simulate holding newborn.    Status Achieved      PT SHORT TERM GOAL #3   Title Pt will be educated on propping and positioning options for nursing her newborn.    Status Achieved               PT Long Term Goals - 02/18/21 0806       PT LONG TERM GOAL #1   Title Pt will be ind with advanced HEP and understand how to safely progress    Status Achieved      PT LONG TERM GOAL #2   Title Pt will demo proper body mechanics and core use with bend, lift and carry 15lb at midline to simulate newborn care techniques.    Status Achieved      PT LONG TERM GOAL #3   Title Pt will report at least 60% reduction in pain with daily tasks related to caring for newborn.    Baseline can fluctuate some days but at least 60% better    Status Achieved      PT LONG TERM GOAL #4   Title Pt will improve FOTO score from 72% to at least 79% to demo improved function.    Baseline 80%    Status Achieved      PT LONG TERM GOAL #5   Title Pt will achieve symmetrical posture and tone across thoracic spine through stretching and stabilization to reduce pain.    Baseline has variable tension due to substitution of thoracic paraspinals as core strength improves    Status Partially Met                   Plan - 02/18/21 0848     Clinical Impression Statement Pt arrived with less pain having added more strengthening focus in at home as discussed last visit.  She has a small concentrated area of tender tension along lower Rt thoracic paraspinals, much smaller in size than at previous visits.  She has ongoing weakness in bil hip IR/ER rated 4/5 bil.  She has single finger diastasis above umbilicus but this is shallow with good deep TA activation.  PT and Pt discussed progression of HEP for ongoing focus on scapular and postural  strength along with specific addition of resistance to hip rotators.  Pt with ongoing Rt adductor flexibility restriction for which Pt knows how to  stretch.  FOTO score has improved to 80%.  Pt has met or partially met all LTGs and will be d/c'd from current POC today.  She has a referral for pelvic floor diagnosis for which she was be evaluated next week.    Comorbidities open heart surgery when younger    PT Frequency 1x / week    PT Duration 8 weeks    PT Treatment/Interventions ADLs/Self Care Home Management;Moist Heat;Electrical Stimulation;Functional mobility training;Therapeutic activities;Therapeutic exercise;Neuromuscular re-education;Manual techniques;Patient/family education;Dry needling;Joint Manipulations;Spinal Manipulations;Passive range of motion    PT Next Visit Plan d/c to HEP, will be evaluated for pelvic pain next week    PT Home Exercise Plan Access Code: [email or web address] plus: Hip flexor stretching and adductor stretching (butterfly)  Self-massage above and below pubic bone  Gentle fascial massage from pubic bone upwards and skewed to the diagonals around your bladder  Deep pressure ball into Rt coccygeus (sitting?) and piriformis (sidelying/on your back - kind of in between these positions)  Cupping fascial release around bladder    Consulted and Agree with Plan of Care Patient             Patient will benefit from skilled therapeutic intervention in order to improve the following deficits and impairments:     Visit Diagnosis: Pain in thoracic spine  Muscle weakness (generalized)     Problem List Patient Active Problem List   Diagnosis Date Noted   Atypical chest pain 01/04/2021   Dyspnea on exertion 01/04/2021   Normal labor and delivery 09/30/2020   SVD (spontaneous vaginal delivery) 09/30/2020   Normal postpartum course 09/30/2020   Adjustment disorder with anxious mood 06/08/2020   Palpitations 05/05/2014   Residual ASD (atrial septal defect) following repair 05/05/2014    PHYSICAL THERAPY DISCHARGE SUMMARY  Visits from Start of Care: 10  Current functional level related to goals / functional  outcomes: See above   Remaining deficits: See above   Education / Equipment: HEP  Patient agrees to discharge. Patient goals were met. Patient is being discharged due to meeting the stated rehab goals.  Venetia Night Natayah Warmack, PT 02/18/21 9:01 AM  [facility] [address] [place], [place], [address] Phone: [phone]   Fax:  [phone]  Name: Kayla Everett MRN: [id number] Date of Birth: [birth year]

## 2021-02-18 NOTE — Patient Instructions (Signed)
Access Code: [email or web address] URL: [email or web address] Date: 02/18/2021 Prepared by: Loistine Simas Tonishia Steffy  Exercises Cat-Camel - 1 x daily - 7 x weekly - 1 sets - 10 reps Cat-Camel to Child's Pose - 1 x daily - 7 x weekly - 1 sets - 10 reps Quadruped Thoracic Rotation Full Range with Hand on Neck - 1 x daily - 7 x weekly - 1 sets - 10 reps Sidelying Thoracic Rotation with Open Book - 1 x daily - 7 x weekly - 1 sets - 10 reps Single Arm Doorway Pec Stretch at 90 Degrees Abduction - 1 x daily - 7 x weekly - 1 sets - 1 reps - 20 hold Supine Shoulder Horizontal Abduction with Resistance - 1 x daily - 7 x weekly - 3 sets - 10 reps Supine Shoulder External Rotation with Resistance - 1 x daily - 7 x weekly - 3 sets - 10 reps Wall Squat with Swiss Ball and Ball Between Knees - 1 x daily - 7 x weekly - 2 sets - 10 reps Bird Dog - 1 x daily - 7 x weekly - 1 sets - 10 reps Bird Dog with Knee Taps - 1 x daily - 7 x weekly - 1 sets - 10 reps Standing Shoulder Row with Anchored Resistance - 1 x daily - 7 x weekly - 2 sets - 15 reps Clamshell with Resistance - 1 x daily - 7 x weekly - 2 sets - 10 reps Sidelying Reverse Clamshell - 1 x daily - 7 x weekly - 2 sets - 10 reps Supine Bridge with Resistance Band - 1 x daily - 7 x weekly - 2 sets - 10 reps Squat with Resistance and Hip Abduction - 1 x daily - 7 x weekly - 2 sets - 10 reps

## 2021-02-23 ENCOUNTER — Ambulatory Visit (HOSPITAL_COMMUNITY): Payer: Self-pay | Admitting: Clinical

## 2021-02-25 ENCOUNTER — Ambulatory Visit: Payer: PRIVATE HEALTH INSURANCE | Admitting: Cardiovascular Disease

## 2021-02-25 ENCOUNTER — Encounter: Payer: Self-pay | Admitting: Physical Therapy

## 2021-02-25 ENCOUNTER — Ambulatory Visit: Payer: PRIVATE HEALTH INSURANCE | Admitting: Physical Therapy

## 2021-02-25 ENCOUNTER — Other Ambulatory Visit: Payer: Self-pay

## 2021-02-25 DIAGNOSIS — R279 Unspecified lack of coordination: Secondary | ICD-10-CM

## 2021-02-25 DIAGNOSIS — M546 Pain in thoracic spine: Secondary | ICD-10-CM | POA: Diagnosis not present

## 2021-02-25 DIAGNOSIS — R2689 Other abnormalities of gait and mobility: Secondary | ICD-10-CM

## 2021-02-25 DIAGNOSIS — M6281 Muscle weakness (generalized): Secondary | ICD-10-CM

## 2021-02-25 NOTE — Therapy (Signed)
[facility] [address], [address] [place], [place], [address] Phone: [phone]   Fax:  [phone]  Physical Therapy Evaluation  Patient Details  Name: Kayla Everett A Godsil MRN: [id number] Date of Birth: 04/07/1991 Referring Provider (PT): Nigel BridgemanLatham, Vicki, [place]   Encounter Date: 02/25/2021   PT End of Session - 02/25/21 0847     Visit Number 1    Date for PT Re-Evaluation 06/27/21    Authorization Type Preferred one    Authorization Time Period through 07/02/21    PT Start Time 0807    PT Stop Time 0845    PT Time Calculation (min) 38 min    Activity Tolerance Patient tolerated treatment well    Behavior [facility] for tasks assessed/performed             Past Medical History:  Diagnosis Date   Anxiety    Depression    Heart murmur    Palpitations    Residual ASD [facility]atrial septal defect[facility]    Past Surgical History:  Procedure Laterality Date   ASD REPAIR      There were no vitals filed for this visit.    Subjective Assessment - 02/25/21 0809     Subjective Pt had first child 08/2020 and reports vaginal birth, no tearing, did have bruising to pubic bone during labor, current nursing, does have vaginal dryness, pelvic pain at pubic bone and pain with penetration and deeper (more pain with deeper). Pt does have current midback pain.    Pertinent History lactating, had baby 09/30/20, ASD repair, scoliosis    Limitations Other (comment);Standing;Sitting    Patient Stated Goals loosen up back and decrease pelvic pain, return to intercourse without pain and activity    Currently in Pain? No/denies                [facility] - 02/25/21 0001       Assessment   Medical Diagnosis N94.10 (ICD-10-CM) - Unspecified dyspareunia    Referring Provider (PT) Nigel BridgemanLatham, Vicki, CNM    Prior Therapy yes recently for back pain and previous pregnancy      Precautions    Precaution Comments nursing      Restrictions   Weight Bearing Restrictions No      Balance Screen   Has the patient fallen in the past 6 months No    Has the patient had a decrease in activity level because of a fear of falling?  No    Is the patient reluctant to leave their home because of a fear of falling?  No      Home Nurse, mental healthnvironment   Living Environment Private residence    Living Arrangements Spouse/significant other;Children    Type of Home House      Prior Function   Vocation Full time employment    Vocation Requirements sittting, just returned to work    Leisure horseback riding, yoga      Cognition   Overall Cognitive Status Within Functional Limits for tasks assessed      Sensation   Light Touch Appears Intact      Coordination   Gross Motor Movements are Fluid and Coordinated Yes    Fine Motor Movements are Fluid and Coordinated Yes      Functional Tests   Functional tests Squat;Single leg stance      Squat   Comments WFL      Single Leg Stance  Comments minor hip drop with Rt SLS      Posture/Postural Control   Posture/Postural Control Postural limitations    Postural Limitations Rounded Shoulders;Posterior pelvic tilt;Increased thoracic kyphosis      ROM / Strength   AROM / PROM / Strength AROM;Strength      AROM   Overall AROM Comments WNL trunk ROM with exception at thoracic side bending and rotation limited by 15%      PROM   Overall PROM Comments [facility]      Strength   Overall Strength Comments bil hip IR/ER 4/5, all other hip strength 5/5 bil, single finger diastasis above umbilicus      Flexibility   Soft Tissue Assessment /Muscle Length yes   bil adductors and hamstrings limited by 25%     Palpation   Spinal mobility mild TTP at bil paraspinals and Rt >Lt PSIS    Palpation comment tender pubic joint and adductors Rt>Lt      Special Tests   Other special tests (-) stork test bil                 No emotional/communication  barriers or cognitive limitation. Patient is motivated to learn. Patient understands and agrees with treatment goals and plan. PT explains patient will be examined in standing, sitting, and lying down to see how their muscles and joints work. When they are ready, they will be asked to remove their underwear so PT can examine their perineum. The patient is also given the option of providing their own chaperone as one is not provided in our facility. The patient also has the right and is explained the right to defer or refuse any part of the evaluation or treatment including the internal exam. With the patient's consent, PT will use one gloved finger to gently assess the muscles of the pelvic floor, seeing how well it contracts and relaxes and if there is muscle symmetry. After, the patient will get dressed and PT and patient will discuss exam findings and plan of care. PT and patient discuss plan of care, schedule, attendance policy and HEP activities.        Objective measurements completed on examination: See above findings.     Pelvic Floor Special Questions - 02/25/21 0001     Prior Pelvic/Prostate Exam Yes    Are you Pregnant or attempting pregnancy? No    Prior Pregnancies Yes    Number of Pregnancies 1    Number of Vaginal Deliveries 1    Any difficulty with labor and deliveries No    Episiotomy Performed No    Currently Sexually Active Yes    Is this Painful No    History of sexually transmitted disease No    Marinoff Scale pain prevents any attempts at intercourse    Urinary Leakage No    Fecal incontinence No    Fluid intake pt feels she drinks enough    Falling out feeling (prolapse) No    External Perineal Exam WFL, vaginal and vulva dryness noted    Prolapse None    Pelvic Floor Internal Exam patient identified and patient confirms consent for PT to perform inernal soft tissue work and muscle strength and integrity assessment    Exam Type Vaginal    Sensation WFL     Palpation mild TTP at pubic symphysis, Lt ischiocavernosus and obturator internus    Strength fair squeeze, definite lift    Strength # of reps 6    Strength # of seconds  5    Tone increased                      PT Education - 02/25/21 0847     Education Details Pt educated on exam findings, female anatomy with model used, vaginal lubricants, and HEP    Person(s) Educated Patient    Methods Explanation;Demonstration;Tactile cues;Verbal cues;Handout    Comprehension Verbalized understanding;Returned demonstration              PT Short Term Goals - 02/25/21 1106       PT SHORT TERM GOAL #1   Title Pt will be ind with initial HEP    Time 5    Period Weeks    Status New    Target Date 04/01/21      PT SHORT TERM GOAL #2   Title Pt do demonstrate improved lifting mechanics to at least 15# with good activation of TA and breathing mechanics.    Time 5    Period Weeks    Status New    Target Date 04/01/21      PT SHORT TERM GOAL #3   Title pt to demonstrate improved pelvic floor strength to at least 4/5 throughout for improved pelvic stability and decreased compensatory strategies    Time 5    Period Weeks    Status New    Target Date 04/01/21      PT SHORT TERM GOAL #4   Title pt to demonstrate improved coordination with core and pelvic floor to contract/relax as needed at least 50% of the time to decrease pelvic pain    Time 5    Period Weeks    Status New    Target Date 04/01/21               PT Long Term Goals - 02/25/21 1111       PT LONG TERM GOAL #1   Title Pt will be ind with advanced HEP and understand how to safely progress    Time 4    Period Months    Status New    Target Date 06/27/21      PT LONG TERM GOAL #2   Title Pt do demonstrate improved lifting mechanics to at least 25# with good activation of TA and breathing mechanics.    Time 4    Period Months    Status New    Target Date 06/27/21      PT LONG TERM GOAL #3    Title pt to demonstrate improved pelvic floor strength to at least 5/5 throughout for improved pelvic stability and decreased compensatory strategies    Time 4    Period Months    Status New    Target Date 06/27/21      PT LONG TERM GOAL #4   Title pt to demonstrate improved coordination with core and pelvic floor to contract/relax as needed at least 75% of the time to decrease pelvic pain    Time 4    Period Months    Status New    Target Date 06/27/21      PT LONG TERM GOAL #5   Title pt to demonstrate improved ability to carry at least 20# for functional gait to improve mechanics in lifitng and pelvic/core stability of carring infant and infant needs without pain    Time 4    Period Months    Status New    Target Date 06/27/21  Additional Long Term Goals   Additional Long Term Goals Yes      PT LONG TERM GOAL #6   Title pt to report no pain with inercourse with vaginal penetration or in deeper muscles for improved QOL.    Time 4    Period Months    Status New    Target Date 06/27/21                    Plan - 02/25/21 0848     Clinical Impression Statement Pt is 30yo female presenting with recent DC from PT for back pain and now for pelvic floor focus. Pt has recent vaginal birth of first child 08/2020 no tearing but did report pubic bone bruising. Pt reports she was very active before pregnancy but had back and hip pain throughout and now feels very weak all over. Pt also reports she is still nursing and has vaginal dryness and pain with intercourse with penetration and deeper. Pt given samples of vaginal lubricant and educated on use. Pt found to have bil hip weakness, decreased rib mobility, decreased mobility at thoracic spine and decreased flexibilty at bil hips. Pt had tenderness at bil PSIS Rt>Lt and bil paraspinals. Pt agreed to internal vaginal exam and found to have increased tone at vaginal opening and weakness in all quadrants at pelvic floor with mild  tenderness at pubic bone and Lt side of pelvis. Pt would benefit from contined PT for addressing deficits found at eval.    Personal Factors and Comorbidities Comorbidity 1;Comorbidity 2;Time since onset of injury/illness/exacerbation;Fitness    Comorbidities open heart surgery when younger, postpartum (3/22)    Examination-Activity Limitations Squat;Stairs;Stand    Examination-Participation Restrictions Other;Interpersonal Relationship   newborn care   Stability/Clinical Decision Making Stable/Uncomplicated    Clinical Decision Making Low    Rehab Potential Good    PT Frequency 1x / week    PT Duration 12 weeks    PT Treatment/Interventions ADLs/Self Care Home Management;Moist Heat;Electrical Stimulation;Functional mobility training;Therapeutic activities;Therapeutic exercise;Neuromuscular re-education;Manual techniques;Patient/family education;Dry needling;Spinal Manipulations;Passive range of motion;Taping;Energy conservation;Vasopneumatic Device    PT Next Visit Plan go over HEP and pelvic relaxation, stretching    PT Home Exercise Plan [email or web address] - for pelvic floor. has one for back as well (Access Code: [email or web address])    Consulted and Agree with Plan of Care Patient             Patient will benefit from skilled therapeutic intervention in order to improve the following deficits and impairments:  Decreased range of motion, Decreased strength, Improper body mechanics, Impaired flexibility, Increased muscle spasms, Hypomobility, Postural dysfunction, Pain, Decreased mobility, Increased fascial restricitons  Visit Diagnosis: Muscle weakness (generalized) - Plan: PT plan of care cert/re-cert  Lack of coordination - Plan: PT plan of care cert/re-cert  Other abnormalities of gait and mobility - Plan: PT plan of care cert/re-cert     Problem List Patient Active Problem List   Diagnosis Date Noted   Atypical chest pain 01/04/2021   Dyspnea on exertion 01/04/2021   Normal labor and  delivery 09/30/2020   SVD (spontaneous vaginal delivery) 09/30/2020   Normal postpartum course 09/30/2020   Adjustment disorder with anxious mood 06/08/2020   Palpitations 05/05/2014   Residual ASD (atrial septal defect) following repair 05/05/2014    Otelia Sergeant, PT, DPT 02/25/2210:19 AM   [facility] [address] [place], [place], [address] Phone: [phone]   Fax:  [phone]  Name: Ladona Ridgel  DONINIQUE LWIN MRN: [id number] Date of Birth: 03-Nov-1990

## 2021-03-22 ENCOUNTER — Ambulatory Visit (HOSPITAL_COMMUNITY): Payer: Self-pay | Admitting: Clinical

## 2021-03-25 ENCOUNTER — Ambulatory Visit: Payer: PRIVATE HEALTH INSURANCE | Attending: Obstetrics and Gynecology | Admitting: Physical Therapy

## 2021-03-25 ENCOUNTER — Other Ambulatory Visit: Payer: Self-pay

## 2021-03-25 ENCOUNTER — Encounter: Payer: Self-pay | Admitting: Physical Therapy

## 2021-03-25 DIAGNOSIS — R252 Cramp and spasm: Secondary | ICD-10-CM | POA: Diagnosis present

## 2021-03-25 DIAGNOSIS — M6281 Muscle weakness (generalized): Secondary | ICD-10-CM | POA: Diagnosis not present

## 2021-03-25 NOTE — Patient Instructions (Signed)
[email or web address]    [facility] [address], [address] [place], [place] [address] Phone # [phone] Fax [phone]

## 2021-03-25 NOTE — Therapy (Signed)
[facility] [address] [place], [place], [address] Phone: [phone]   Fax:  [phone]  Physical Therapy Treatment  Patient Details  Name: Kayla Everett MRN: [id number] Date of Birth: 1990/08/26 Referring Provider (PT): Nigel Bridgeman, [place]   Encounter Date: 03/25/2021   PT End of Session - 03/25/21 1015     Visit Number 2    Date for PT Re-Evaluation 06/27/21    Authorization Type Preferred one    Authorization Time Period through 07/02/21    PT Start Time 0850    PT Stop Time 0932    PT Time Calculation (min) 42 min    Activity Tolerance Patient tolerated treatment well    Behavior During Therapy [facility] for tasks assessed/performed             Past Medical History:  Diagnosis Date   Anxiety    Depression    Heart murmur    Palpitations    Residual ASD (atrial septal defect) following repair june 2012   [facility]    Past Surgical History:  Procedure Laterality Date   ASD REPAIR      There were no vitals filed for this visit.   Subjective Assessment - 03/25/21 0855     Subjective Pt reports she still has pelvic pain with intercourse preventing penetration                               [facility] Adult PT Treatment/Exercise - 03/25/21 0001       Exercises   Exercises Lumbar;Knee/Hip      Lumbar Exercises: Stretches   Other Lumbar Stretch Exercise childs pose x45s; 5x10s adductor rocks    Other Lumbar Stretch Exercise pelvic tilts x10 ant/post and Rt/LT on pool noodle      Lumbar Exercises: Supine   Bridge 20 reps    Bridge Limitations 2x10 black loop with hip abd      Manual Therapy   Manual Therapy Soft tissue mobilization    Soft tissue mobilization manual work at bil adductors for decreased restrictions noted tightness proximal and distal attachment sites                     PT Education - 03/25/21 1015     Education Details pt educated on HEP,  vaginal moisturizer as she is postpartum and still nursing and reports continued dryness vaginally. Pt also given handout for pelvic floor relaxation video.    Person(s) Educated Patient    Methods Explanation;Demonstration;Tactile cues;Verbal cues;Handout    Comprehension Verbalized understanding;Returned demonstration              PT Short Term Goals - 02/25/21 1106       PT SHORT TERM GOAL #1   Title Pt will be ind with initial HEP    Time 5    Period Weeks    Status New    Target Date 04/01/21      PT SHORT TERM GOAL #2   Title Pt do demonstrate improved lifting mechanics to at least 15# with good activation of TA and breathing mechanics.    Time 5    Period Weeks    Status New    Target Date 04/01/21      PT SHORT TERM GOAL #3   Title pt to demonstrate improved pelvic floor strength to at least 4/5 throughout for improved pelvic stability and decreased compensatory  strategies    Time 5    Period Weeks    Status New    Target Date 04/01/21      PT SHORT TERM GOAL #4   Title pt to demonstrate improved coordination with core and pelvic floor to contract/relax as needed at least 50% of the time to decrease pelvic pain    Time 5    Period Weeks    Status New    Target Date 04/01/21               PT Long Term Goals - 02/25/21 1111       PT LONG TERM GOAL #1   Title Pt will be ind with advanced HEP and understand how to safely progress    Time 4    Period Months    Status New    Target Date 06/27/21      PT LONG TERM GOAL #2   Title Pt do demonstrate improved lifting mechanics to at least 25# with good activation of TA and breathing mechanics.    Time 4    Period Months    Status New    Target Date 06/27/21      PT LONG TERM GOAL #3   Title pt to demonstrate improved pelvic floor strength to at least 5/5 throughout for improved pelvic stability and decreased compensatory strategies    Time 4    Period Months    Status New    Target Date 06/27/21       PT LONG TERM GOAL #4   Title pt to demonstrate improved coordination with core and pelvic floor to contract/relax as needed at least 75% of the time to decrease pelvic pain    Time 4    Period Months    Status New    Target Date 06/27/21      PT LONG TERM GOAL #5   Title pt to demonstrate improved ability to carry at least 20# for functional gait to improve mechanics in lifitng and pelvic/core stability of carring infant and infant needs without pain    Time 4    Period Months    Status New    Target Date 06/27/21      Additional Long Term Goals   Additional Long Term Goals Yes      PT LONG TERM GOAL #6   Title pt to report no pain with inercourse with vaginal penetration or in deeper muscles for improved QOL.    Time 4    Period Months    Status New    Target Date 06/27/21                   Plan - 03/25/21 1017     Clinical Impression Statement Pt presenting to clinic reporting feeling a little better but hasn't been able to complete HEP this past week, continues to have pain with vaginal penetration and unable to have intercourse postpartum due to this. Pt given additional lubricant sample to see if this is helpful for her, handout of positions for intercourse to attempt to decrease pain, and mositurizer to use at vulva postpartum to decrease vaginal dryness. Pt session focused on hip stretching and pelvic relaxation techniques, given handout for pelvic floor relaxation as well,manual work at bil adductors with trigger points noted and tenderness at proximal and distal attachment sites, pt tolerated session well. Pt would benefit from continued PT to address continued deficits found.    Personal Factors and Comorbidities Comorbidity 1;Comorbidity  2;Time since onset of injury/illness/exacerbation;Fitness    Comorbidities open heart surgery when younger, postpartum (3/22)    Examination-Activity Limitations Squat;Stairs;Stand    Examination-Participation Restrictions  Other;Interpersonal Relationship    Stability/Clinical Decision Making Stable/Uncomplicated    Clinical Decision Making Low    Rehab Potential Good    PT Frequency 1x / week    PT Duration 12 weeks    PT Treatment/Interventions ADLs/Self Care Home Management;Moist Heat;Electrical Stimulation;Functional mobility training;Therapeutic activities;Therapeutic exercise;Neuromuscular re-education;Manual techniques;Patient/family education;Dry needling;Spinal Manipulations;Passive range of motion;Taping;Energy conservation;Vasopneumatic Device    PT Next Visit Plan go over HEP and pelvic relaxation, stretching    PT Home Exercise Plan [email or web address] - for pelvic floor. has one for back as well (Access Code: [email or web address])    Consulted and Agree with Plan of Care Patient             Patient will benefit from skilled therapeutic intervention in order to improve the following deficits and impairments:  Decreased range of motion, Decreased strength, Improper body mechanics, Impaired flexibility, Increased muscle spasms, Hypomobility, Postural dysfunction, Pain, Decreased mobility, Increased fascial restricitons  Visit Diagnosis: Muscle weakness (generalized)  Cramp and spasm     Problem List Patient Active Problem List   Diagnosis Date Noted   Atypical chest pain 01/04/2021   Dyspnea on exertion 01/04/2021   Normal labor and delivery 09/30/2020   SVD (spontaneous vaginal delivery) 09/30/2020   Normal postpartum course 09/30/2020   Adjustment disorder with anxious mood 06/08/2020   Palpitations 05/05/2014   Residual ASD (atrial septal defect) following repair 05/05/2014    Otelia Sergeant, PT, DPT 03/25/2209:23 AM    [facility] [address] [place], [place], [address] Phone: [phone]   Fax:  [phone]  Name: Kayla Everett MRN: [id number] Date of Birth: 14-Mar-1991

## 2021-03-29 ENCOUNTER — Ambulatory Visit: Payer: PRIVATE HEALTH INSURANCE | Admitting: Family

## 2021-04-08 ENCOUNTER — Encounter: Payer: PRIVATE HEALTH INSURANCE | Admitting: Physical Therapy

## 2021-04-12 ENCOUNTER — Ambulatory Visit (INDEPENDENT_AMBULATORY_CARE_PROVIDER_SITE_OTHER): Payer: PRIVATE HEALTH INSURANCE | Admitting: Family

## 2021-04-12 ENCOUNTER — Ambulatory Visit: Payer: PRIVATE HEALTH INSURANCE | Admitting: Family

## 2021-04-12 ENCOUNTER — Other Ambulatory Visit: Payer: Self-pay

## 2021-04-12 VITALS — BP 98/60 | HR 68 | Temp 98.5°F | Ht 62.0 in | Wt 110.4 lb

## 2021-04-12 DIAGNOSIS — N644 Mastodynia: Secondary | ICD-10-CM | POA: Diagnosis not present

## 2021-04-12 DIAGNOSIS — F4322 Adjustment disorder with anxiety: Secondary | ICD-10-CM

## 2021-04-12 DIAGNOSIS — Z23 Encounter for immunization: Secondary | ICD-10-CM | POA: Diagnosis not present

## 2021-04-12 DIAGNOSIS — R634 Abnormal weight loss: Secondary | ICD-10-CM

## 2021-04-12 DIAGNOSIS — H539 Unspecified visual disturbance: Secondary | ICD-10-CM

## 2021-04-12 NOTE — Progress Notes (Signed)
Kayla Everett is a 30 y.o. female with the following history as recorded in EpicCare:  Patient Active Problem List   Diagnosis Date Noted   Atypical chest pain 01/04/2021   Dyspnea on exertion 01/04/2021   Normal labor and delivery 09/30/2020   SVD (spontaneous vaginal delivery) 09/30/2020   Normal postpartum course 09/30/2020   Adjustment disorder with anxious mood 06/08/2020   Palpitations 05/05/2014   Residual ASD (atrial septal defect) following repair 05/05/2014    Current Outpatient Medications  Medication Sig Dispense Refill   butalbital-acetaminophen-caffeine (FIORICET) 50-325-40 MG tablet Take 1 tablet by mouth every 4 (four) hours as needed for headache.     Cholecalciferol (VITAMIN D) 50 MCG (2000 UT) tablet Take 2,000 Units by mouth daily.     fluticasone (FLONASE) 50 MCG/ACT nasal spray Place 1 spray into both nostrils daily.     ibuprofen (ADVIL) 600 MG tablet Take 1 tablet (600 mg total) by mouth every 6 (six) hours. 30 tablet 0   magnesium oxide (MAG-OX) 400 MG tablet Take 400 mg by mouth daily.     Prenatal Vit-Fe Fumarate-FA (PRENATAL MULTIVITAMIN) TABS tablet Take 1 tablet by mouth daily at 12 noon.     No current facility-administered medications for this visit.    Allergies: Patient has no known allergies.  Past Medical History:  Diagnosis Date   Anxiety    Depression    Heart murmur    Palpitations    Residual ASD (atrial septal defect) following repair june 2012   [facility]    Past Surgical History:  Procedure Laterality Date   ASD REPAIR      Family History  Problem Relation Age of Onset   Hyperlipidemia Father    Heart disease Father    Hyperlipidemia Paternal Grandfather    Heart disease Paternal Grandfather     Social History   Tobacco Use   Smoking status: Never   Smokeless tobacco: Never  Substance Use Topics   Alcohol use: No    Alcohol/week: 0.0 standard drinks    Subjective:   Patient presents today as a TOC ; 6 months  post-partum; Has noticed that vision has "changed" since having child; works full time on computer- finds hard to "focus on screen."  Baseline weight at start of pregnancy 116 pounds; notes having difficulty maintaining weight since giving birth/ breast-feeding; has lost 6 pounds since July 2022; Walking 2-3 miles per day;  History of anxiety- does not want medication; is planning to re-start working with therapist;      Objective:  Vitals:   04/12/21 1405  BP: 98/60  Pulse: 68  Temp: 98.5 F (36.9 C)  TempSrc: Oral  SpO2: 99%  Weight: 110 lb 6.4 oz (50.1 kg)  Height: $Remove'5\' 2"'oWRtmNe$  (1.575 m)    General: Well developed, well nourished, in no acute distress  Skin : Warm and dry.  Head: Normocephalic and atraumatic  Eyes: Sclera and conjunctiva clear; pupils round and reactive to light; extraocular movements intact  Ears: External normal; canals clear; tympanic membranes normal  Oropharynx: Pink, supple. No suspicious lesions  Neck: Supple without thyromegaly, adenopathy  Lungs: Respirations unlabored; clear to auscultation bilaterally without wheeze, rales, rhonchi  CVS exam: normal rate and regular rhythm.  Neurologic: Alert and oriented; speech intact; face symmetrical; moves all extremities well; CNII-XII intact without focal deficit   Assessment:  1. Weight loss   2. Need for immunization against influenza   3. Adjustment disorder with anxious mood   4. Nipple  pain   5. Vision changes     Plan:   Currently breast-feeding; will update labs- need to rule out post-partum thyroiditis; Flu shot given; Patient has already reached out to therapist to hopefully re-start counseling soon; discussed medication but patient defers at this time. Has already met with lactation specialist in the past week and does feel that symptoms have improved slightly; she will continue to apply warm compresses; will consider antibiotics if symptoms persist.  She already has appointment with eye doctor- to  consider neurology consult if symptoms persist.   No follow-ups on file.  Orders Placed This Encounter  Procedures   Flu Vaccine QUAD 35mo+IM (Fluarix, Fluzone & Alfiuria Quad PF)   CBC with Differential/Platelet   Comp Met (CMET)   TSH    Requested Prescriptions    No prescriptions requested or ordered in this encounter

## 2021-04-13 ENCOUNTER — Encounter: Payer: Self-pay | Admitting: Family

## 2021-04-13 ENCOUNTER — Ambulatory Visit (HOSPITAL_COMMUNITY): Payer: Self-pay | Admitting: Clinical

## 2021-04-14 ENCOUNTER — Other Ambulatory Visit: Payer: Self-pay | Admitting: Family

## 2021-04-14 DIAGNOSIS — F4322 Adjustment disorder with anxiety: Secondary | ICD-10-CM

## 2021-04-15 ENCOUNTER — Ambulatory Visit: Payer: PRIVATE HEALTH INSURANCE | Attending: Obstetrics and Gynecology | Admitting: Physical Therapy

## 2021-04-15 ENCOUNTER — Other Ambulatory Visit: Payer: Self-pay

## 2021-04-15 DIAGNOSIS — M6281 Muscle weakness (generalized): Secondary | ICD-10-CM | POA: Insufficient documentation

## 2021-04-15 DIAGNOSIS — R279 Unspecified lack of coordination: Secondary | ICD-10-CM | POA: Insufficient documentation

## 2021-04-15 DIAGNOSIS — R252 Cramp and spasm: Secondary | ICD-10-CM | POA: Insufficient documentation

## 2021-04-15 NOTE — Therapy (Signed)
[facility] @ [facility] [address] [place], [place], [address] Phone: [phone]   Fax:  [phone]  Physical Therapy Treatment  Patient Details  Name: Kayla Everett MRN: [id number] Date of Birth: Dec 30, 1990 Referring Provider (PT): Nigel Bridgeman, [place]   Encounter Date: 04/15/2021   PT End of Session - 04/15/21 0929     Visit Number 3    Date for PT Re-Evaluation 06/27/21    Authorization Type Preferred one    Authorization Time Period through 07/02/21    Authorization - Visit Number 10    PT Start Time 0855    PT Stop Time 0928    PT Time Calculation (min) 33 min    Activity Tolerance Patient tolerated treatment well    Behavior During Therapy [facility] for tasks assessed/performed             Past Medical History:  Diagnosis Date   Anxiety    Depression    Heart murmur    Palpitations    Residual ASD (atrial septal defect) following repair june 2012   [facility]    Past Surgical History:  Procedure Laterality Date   ASD REPAIR      There were no vitals filed for this visit.   Subjective Assessment - 04/15/21 0857     Subjective Pt reports her and husband able to have intercourse however did have pain with penetration and first few minutes was painful with mobility but improved.    Pertinent History lactating, had baby 09/30/20, ASD repair, scoliosis    Limitations Other (comment);Standing;Sitting    Patient Stated Goals loosen up back and decrease pelvic pain, return to intercourse without pain and activity    Currently in Pain? Yes    Pain Score 1     Pain Location Back    Pain Orientation Left;Mid    Pain Descriptors / Indicators Aching    Pain Type Chronic pain                               [facility] Adult PT Treatment/Exercise - 04/15/21 0001       Lumbar Exercises: Stretches   Other Lumbar Stretch Exercise supine lying on soft foam roll for thoracic extension 2x45s       Manual Therapy   Manual Therapy Soft tissue mobilization    Soft tissue mobilization manual work at thoracic spine with suction cup and therapist manual work for improved tissue mobility and trigger point release with pt reporting noted pain and tightness in this region and unable to complete full pelvic stretching at home. Pt tolerated well and reported improvement at end of treatment.                     PT Education - 04/15/21 0929     Education Details Pt educated on pelvic floor relaxation and thoracic stretching for home    Person(s) Educated Patient    Methods Explanation;Demonstration;Tactile cues;Verbal cues    Comprehension Verbalized understanding;Returned demonstration              PT Short Term Goals - 02/25/21 1106       PT SHORT TERM GOAL #1   Title Pt will be ind with initial HEP    Time 5    Period Weeks    Status New    Target Date 04/01/21      PT SHORT TERM GOAL #  2   Title Pt do demonstrate improved lifting mechanics to at least 15# with good activation of TA and breathing mechanics.    Time 5    Period Weeks    Status New    Target Date 04/01/21      PT SHORT TERM GOAL #3   Title pt to demonstrate improved pelvic floor strength to at least 4/5 throughout for improved pelvic stability and decreased compensatory strategies    Time 5    Period Weeks    Status New    Target Date 04/01/21      PT SHORT TERM GOAL #4   Title pt to demonstrate improved coordination with core and pelvic floor to contract/relax as needed at least 50% of the time to decrease pelvic pain    Time 5    Period Weeks    Status New    Target Date 04/01/21               PT Long Term Goals - 02/25/21 1111       PT LONG TERM GOAL #1   Title Pt will be ind with advanced HEP and understand how to safely progress    Time 4    Period Months    Status New    Target Date 06/27/21      PT LONG TERM GOAL #2   Title Pt do demonstrate improved lifting mechanics to  at least 25# with good activation of TA and breathing mechanics.    Time 4    Period Months    Status New    Target Date 06/27/21      PT LONG TERM GOAL #3   Title pt to demonstrate improved pelvic floor strength to at least 5/5 throughout for improved pelvic stability and decreased compensatory strategies    Time 4    Period Months    Status New    Target Date 06/27/21      PT LONG TERM GOAL #4   Title pt to demonstrate improved coordination with core and pelvic floor to contract/relax as needed at least 75% of the time to decrease pelvic pain    Time 4    Period Months    Status New    Target Date 06/27/21      PT LONG TERM GOAL #5   Title pt to demonstrate improved ability to carry at least 20# for functional gait to improve mechanics in lifitng and pelvic/core stability of carring infant and infant needs without pain    Time 4    Period Months    Status New    Target Date 06/27/21      Additional Long Term Goals   Additional Long Term Goals Yes      PT LONG TERM GOAL #6   Title pt to report no pain with inercourse with vaginal penetration or in deeper muscles for improved QOL.    Time 4    Period Months    Status New    Target Date 06/27/21                   Plan - 04/15/21 0930     Clinical Impression Statement Pt presents to clinic reporting some improvement with pain with intercourse but still has pain with penetration and deep penetration for initial intercourse however improving. Pt reports she has had pain in Lt and Rt thoracic region limiting ability to complete pelvic relaxation exerices intermittently, session focused on manual work at thoracic  spine to release trigger points and muscle tightness with good effect. Pt would benefit from continued PT to address continued deficits found.    Personal Factors and Comorbidities Comorbidity 1;Comorbidity 2;Time since onset of injury/illness/exacerbation;Fitness    Comorbidities open heart surgery when  younger, postpartum (3/22)    Examination-Activity Limitations Squat;Stairs;Stand    Examination-Participation Restrictions Other;Interpersonal Relationship    Stability/Clinical Decision Making Stable/Uncomplicated    Rehab Potential Good    PT Frequency 1x / week    PT Duration 12 weeks    PT Treatment/Interventions ADLs/Self Care Home Management;Moist Heat;Electrical Stimulation;Functional mobility training;Therapeutic activities;Therapeutic exercise;Neuromuscular re-education;Manual techniques;Patient/family education;Dry needling;Spinal Manipulations;Passive range of motion;Taping;Energy conservation;Vasopneumatic Device    PT Next Visit Plan internal work for stretching    PT Home Exercise Plan [email or web address] - for pelvic floor. has one for back as well (Access Code: [email or web address])    Consulted and Agree with Plan of Care Patient             Patient will benefit from skilled therapeutic intervention in order to improve the following deficits and impairments:  Decreased range of motion, Decreased strength, Improper body mechanics, Impaired flexibility, Increased muscle spasms, Hypomobility, Postural dysfunction, Pain, Decreased mobility, Increased fascial restricitons  Visit Diagnosis: Cramp and spasm  Lack of coordination  Muscle weakness (generalized)     Problem List Patient Active Problem List   Diagnosis Date Noted   Atypical chest pain 01/04/2021   Dyspnea on exertion 01/04/2021   Normal labor and delivery 09/30/2020   SVD (spontaneous vaginal delivery) 09/30/2020   Normal postpartum course 09/30/2020   Adjustment disorder with anxious mood 06/08/2020   Palpitations 05/05/2014   Residual ASD (atrial septal defect) following repair 05/05/2014    Otelia Sergeant, PT, DPT 10/14/229:33 AM   [facility] @ [facility] [address] [place], [place], [address] Phone: [phone]   Fax:  [phone]  Name: Kayla Everett MRN:  [id number] Date of Birth: [birth year]

## 2021-04-21 ENCOUNTER — Encounter: Payer: PRIVATE HEALTH INSURANCE | Admitting: Physical Therapy

## 2021-04-22 ENCOUNTER — Encounter: Payer: PRIVATE HEALTH INSURANCE | Admitting: Physical Therapy

## 2021-04-26 ENCOUNTER — Encounter: Payer: Self-pay | Admitting: Family

## 2021-04-28 ENCOUNTER — Encounter: Payer: Self-pay | Admitting: Family

## 2021-04-29 ENCOUNTER — Encounter: Payer: PRIVATE HEALTH INSURANCE | Admitting: Physical Therapy

## 2021-05-02 ENCOUNTER — Ambulatory Visit (INDEPENDENT_AMBULATORY_CARE_PROVIDER_SITE_OTHER): Payer: PRIVATE HEALTH INSURANCE | Admitting: Psychology

## 2021-05-02 DIAGNOSIS — F411 Generalized anxiety disorder: Secondary | ICD-10-CM

## 2021-05-03 ENCOUNTER — Other Ambulatory Visit: Payer: Self-pay

## 2021-05-03 ENCOUNTER — Other Ambulatory Visit: Payer: Self-pay | Admitting: Family

## 2021-05-03 ENCOUNTER — Ambulatory Visit: Payer: PRIVATE HEALTH INSURANCE | Attending: Obstetrics and Gynecology | Admitting: Physical Therapy

## 2021-05-03 DIAGNOSIS — R252 Cramp and spasm: Secondary | ICD-10-CM | POA: Insufficient documentation

## 2021-05-03 DIAGNOSIS — R279 Unspecified lack of coordination: Secondary | ICD-10-CM | POA: Diagnosis present

## 2021-05-03 DIAGNOSIS — M6281 Muscle weakness (generalized): Secondary | ICD-10-CM | POA: Diagnosis present

## 2021-05-03 MED ORDER — PROPRANOLOL HCL 10 MG PO TABS: 10.0000 mg | ORAL_TABLET | Freq: Two times a day (BID) | ORAL | 0 refills | Status: DC | PRN

## 2021-05-03 NOTE — Therapy (Signed)
[facility] @ [facility] [address] [place], [place], [address] Phone: [phone]   Fax:  [phone]  Physical Therapy Treatment  Patient Details  Name: Kayla Everett MRN: [id number] Date of Birth: Nov 22, 1990 Referring Provider (PT): Nigel Bridgeman, [place]   Encounter Date: 05/03/2021   PT End of Session - 05/03/21 1533     Visit Number 4    Date for PT Re-Evaluation 06/27/21    Authorization Type Preferred one    Authorization Time Period through 07/02/21    Authorization - Visit Number 10    PT Start Time 1454    PT Stop Time 1533    PT Time Calculation (min) 39 min    Activity Tolerance Patient tolerated treatment well             Past Medical History:  Diagnosis Date   Anxiety    Depression    Heart murmur    Palpitations    Residual ASD (atrial septal defect) following repair june 2012   [facility]    Past Surgical History:  Procedure Laterality Date   ASD REPAIR      There were no vitals filed for this visit.   Subjective Assessment - 05/03/21 1457     Subjective Pt reports she some pubic bone aching and thought she was having period cramping but did not start.    Pertinent History lactating, had baby 09/30/20, ASD repair, scoliosis    Limitations Other (comment);Standing;Sitting    Patient Stated Goals loosen up back and decrease pelvic pain, return to intercourse without pain and activity    Currently in Pain? No/denies                               [facility] Adult PT Treatment/Exercise - 05/03/21 0001       Lumbar Exercises: Standing   Functional Squats 20 reps    Functional Squats Limitations 2x10 2 5# DB; x10 with baby for resistance    Forward Lunge 20 reps    Forward Lunge Limitations with resisted row green band 2x10    Other Standing Lumbar Exercises mario punches 5#2 x10; deadlift x5# 2x10    Other Standing Lumbar Exercises green palloffs x10; palloff rotation green band  x10      Lumbar Exercises: Supine   Other Supine Lumbar Exercises opp arm press and hip flexion x10 5# DB      Lumbar Exercises: Quadruped   Opposite Arm/Leg Raise Right arm/Left leg;Left arm/Right leg;20 reps                     PT Education - 05/03/21 1533     Education Details Pt educated on breathing technique with all exercises.    Person(s) Educated Patient    Methods Explanation;Demonstration;Tactile cues;Verbal cues    Comprehension Verbalized understanding;Returned demonstration              PT Short Term Goals - 05/03/21 1541       PT SHORT TERM GOAL #1   Title Pt will be ind with initial HEP    Time 5    Period Weeks    Status On-going    Target Date 04/01/21      PT SHORT TERM GOAL #2   Title Pt do demonstrate improved lifting mechanics to at least 15# with good activation of TA and breathing mechanics.    Baseline 10#  Time 5    Period Weeks    Status On-going    Target Date 04/01/21      PT SHORT TERM GOAL #3   Title pt to demonstrate improved pelvic floor strength to at least 4/5 throughout for improved pelvic stability and decreased compensatory strategies    Time 5    Period Weeks    Status On-going    Target Date 04/01/21      PT SHORT TERM GOAL #4   Title pt to demonstrate improved coordination with core and pelvic floor to contract/relax as needed at least 50% of the time to decrease pelvic pain    Time 5    Period Weeks    Status On-going    Target Date 04/01/21               PT Long Term Goals - 05/03/21 1542       PT LONG TERM GOAL #1   Title Pt will be ind with advanced HEP and understand how to safely progress    Time 4    Period Months    Status On-going      PT LONG TERM GOAL #2   Title Pt do demonstrate improved lifting mechanics to at least 25# with good activation of TA and breathing mechanics.    Baseline 10#    Time 4    Period Months    Status On-going      PT LONG TERM GOAL #3   Title pt to  demonstrate improved pelvic floor strength to at least 5/5 throughout for improved pelvic stability and decreased compensatory strategies    Time 4    Period Months    Status On-going      PT LONG TERM GOAL #4   Title pt to demonstrate improved coordination with core and pelvic floor to contract/relax as needed at least 75% of the time to decrease pelvic pain    Time 4    Period Months    Status New      PT LONG TERM GOAL #5   Title pt to demonstrate improved ability to carry at least 20# for functional gait to improve mechanics in lifitng and pelvic/core stability of carring infant and infant needs without pain    Baseline 10#    Time 4    Period Months    Status On-going      PT LONG TERM GOAL #6   Title pt to report no pain with inercourse with vaginal penetration or in deeper muscles for improved QOL.    Time 4    Period Months    Status On-going                   Plan - 05/03/21 1534     Clinical Impression Statement Pt presents to clininc reporting slight pelvic/pubic bone pain on one occasion since last visit but has returned to yoga and working out and wasn't sure if this was from that or possibly period starting however these have been irregular postpartum. Pt denied pain this session, hasn't had intercourse since last session so unsure if there have been changes in pelvic pain with penetration. Pt requested stengthening focus on session this date with pt needing to bring in baby today and did not want to do internal. This was completed with VC for technique of exercises and breathing mechanics for improved anterior sling strength to decrease pelvic pain and improve core strength for decreased pain overall. Pt tolerated well  and would benefit from continued PT to further address needs.    Personal Factors and Comorbidities Comorbidity 1;Comorbidity 2;Time since onset of injury/illness/exacerbation;Fitness    Comorbidities open heart surgery when younger, postpartum  (3/22)    Examination-Activity Limitations Squat;Stairs;Stand    Examination-Participation Restrictions Other;Interpersonal Relationship    Stability/Clinical Decision Making Stable/Uncomplicated    Rehab Potential Good    PT Frequency 1x / week    PT Duration 12 weeks    PT Treatment/Interventions ADLs/Self Care Home Management;Moist Heat;Electrical Stimulation;Functional mobility training;Therapeutic activities;Therapeutic exercise;Neuromuscular re-education;Manual techniques;Patient/family education;Dry needling;Spinal Manipulations;Passive range of motion;Taping;Energy conservation;Vasopneumatic Device    PT Next Visit Plan internal work for stretching    PT Home Exercise Plan [email or web address] - for pelvic floor. has one for back as well (Access Code: [email or web address])    Consulted and Agree with Plan of Care Patient             Patient will benefit from skilled therapeutic intervention in order to improve the following deficits and impairments:  Decreased range of motion, Decreased strength, Improper body mechanics, Impaired flexibility, Increased muscle spasms, Hypomobility, Postural dysfunction, Pain, Decreased mobility, Increased fascial restricitons  Visit Diagnosis: Lack of coordination  Cramp and spasm  Muscle weakness (generalized)     Problem List Patient Active Problem List   Diagnosis Date Noted   Atypical chest pain 01/04/2021   Dyspnea on exertion 01/04/2021   Normal labor and delivery 09/30/2020   SVD (spontaneous vaginal delivery) 09/30/2020   Normal postpartum course 09/30/2020   Adjustment disorder with anxious mood 06/08/2020   Palpitations 05/05/2014   Residual ASD (atrial septal defect) following repair 05/05/2014    Otelia Sergeant, PT, DPT 11/01/223:43 PM   [facility] [address] [place], [place], [address] Phone: [phone]   Fax:  [phone]  Name: RAYCHELL HOLCOMB MRN: [id number] Date of  Birth: 12-19-1990

## 2021-05-16 ENCOUNTER — Ambulatory Visit (INDEPENDENT_AMBULATORY_CARE_PROVIDER_SITE_OTHER): Payer: PRIVATE HEALTH INSURANCE | Admitting: Psychology

## 2021-05-16 DIAGNOSIS — F411 Generalized anxiety disorder: Secondary | ICD-10-CM | POA: Diagnosis not present

## 2021-05-30 ENCOUNTER — Ambulatory Visit (INDEPENDENT_AMBULATORY_CARE_PROVIDER_SITE_OTHER): Payer: PRIVATE HEALTH INSURANCE | Admitting: Psychology

## 2021-05-30 DIAGNOSIS — F411 Generalized anxiety disorder: Secondary | ICD-10-CM

## 2021-06-15 ENCOUNTER — Ambulatory Visit: Payer: PRIVATE HEALTH INSURANCE | Attending: Obstetrics and Gynecology | Admitting: Physical Therapy

## 2021-06-15 ENCOUNTER — Other Ambulatory Visit: Payer: Self-pay

## 2021-06-15 DIAGNOSIS — R269 Unspecified abnormalities of gait and mobility: Secondary | ICD-10-CM | POA: Diagnosis present

## 2021-06-15 DIAGNOSIS — R293 Abnormal posture: Secondary | ICD-10-CM | POA: Insufficient documentation

## 2021-06-15 DIAGNOSIS — R279 Unspecified lack of coordination: Secondary | ICD-10-CM | POA: Diagnosis present

## 2021-06-15 DIAGNOSIS — M6281 Muscle weakness (generalized): Secondary | ICD-10-CM | POA: Insufficient documentation

## 2021-06-15 DIAGNOSIS — R252 Cramp and spasm: Secondary | ICD-10-CM | POA: Diagnosis present

## 2021-06-15 NOTE — Therapy (Signed)
[facility] @ [facility] [address] [place], [place], [address] Phone: [phone]   Fax:  [phone]  Physical Therapy Treatment  Patient Details  Name: Kayla Everett MRN: [id number] Date of Birth: 03-06-1991 Referring Provider (PT): Nigel Bridgeman, [place]   Encounter Date: 06/15/2021   PT End of Session - 06/15/21 1202     Visit Number 5    Date for PT Re-Evaluation 06/27/21    Authorization Type Preferred one    Authorization Time Period through 07/02/21    Authorization - Visit Number 10    PT Start Time 1106    PT Stop Time 1153    PT Time Calculation (min) 47 min    Activity Tolerance Patient tolerated treatment well    Behavior During Therapy [facility] for tasks assessed/performed             Past Medical History:  Diagnosis Date   Anxiety    Depression    Heart murmur    Palpitations    Residual ASD (atrial septal defect) following repair june 2012   [facility]    Past Surgical History:  Procedure Laterality Date   ASD REPAIR      There were no vitals filed for this visit.   Subjective Assessment - 06/15/21 1143     Subjective Pt reports her period causes pubic bone aching, still having some pain with penetration but this is getting better.    Pertinent History lactating, had baby 09/30/20, ASD repair, scoliosis    Limitations Other (comment);Standing;Sitting    Patient Stated Goals loosen up back and decrease pelvic pain, return to intercourse without pain and activity    Currently in Pain? No/denies                               [facility] Adult PT Treatment/Exercise - 06/15/21 0001       Lumbar Exercises: Stretches   Other Lumbar Stretch Exercise sidelying lateral trunk manual stretch x30s each side    Other Lumbar Stretch Exercise sidelying thoracic opening x30s each with overpressure; seated hip 90/90 x1 min each with hip prop needed under Lt hip.      Manual Therapy   Manual  Therapy Soft tissue mobilization;Myofascial release    Soft tissue mobilization manual work at Rt thoracic and lumbar paraspinals with therapist providing soft tissue massage and trigger point release and use of addaday to assist. PT also provided rib expansion and retraction with breathing x10 with manual assist for improved mobility with breathing mechanics.                     PT Education - 06/15/21 1159     Education Details Pt educated on additional core exercises, thoracic stretching, and rib mobility also pelvic floor stretching and lubricant use for intercourse.    Person(s) Educated Patient    Methods Explanation;Demonstration;Tactile cues;Verbal cues    Comprehension Verbalized understanding;Returned demonstration              PT Short Term Goals - 05/03/21 1541       PT SHORT TERM GOAL #1   Title Pt will be ind with initial HEP    Time 5    Period Weeks    Status On-going    Target Date 04/01/21      PT SHORT TERM GOAL #2   Title Pt do demonstrate improved lifting mechanics to at  least 15# with good activation of TA and breathing mechanics.    Baseline 10#    Time 5    Period Weeks    Status On-going    Target Date 04/01/21      PT SHORT TERM GOAL #3   Title pt to demonstrate improved pelvic floor strength to at least 4/5 throughout for improved pelvic stability and decreased compensatory strategies    Time 5    Period Weeks    Status On-going    Target Date 04/01/21      PT SHORT TERM GOAL #4   Title pt to demonstrate improved coordination with core and pelvic floor to contract/relax as needed at least 50% of the time to decrease pelvic pain    Time 5    Period Weeks    Status On-going    Target Date 04/01/21               PT Long Term Goals - 05/03/21 1542       PT LONG TERM GOAL #1   Title Pt will be ind with advanced HEP and understand how to safely progress    Time 4    Period Months    Status On-going      PT LONG TERM  GOAL #2   Title Pt do demonstrate improved lifting mechanics to at least 25# with good activation of TA and breathing mechanics.    Baseline 10#    Time 4    Period Months    Status On-going      PT LONG TERM GOAL #3   Title pt to demonstrate improved pelvic floor strength to at least 5/5 throughout for improved pelvic stability and decreased compensatory strategies    Time 4    Period Months    Status On-going      PT LONG TERM GOAL #4   Title pt to demonstrate improved coordination with core and pelvic floor to contract/relax as needed at least 75% of the time to decrease pelvic pain    Time 4    Period Months    Status New      PT LONG TERM GOAL #5   Title pt to demonstrate improved ability to carry at least 20# for functional gait to improve mechanics in lifitng and pelvic/core stability of carring infant and infant needs without pain    Baseline 10#    Time 4    Period Months    Status On-going      PT LONG TERM GOAL #6   Title pt to report no pain with inercourse with vaginal penetration or in deeper muscles for improved QOL.    Time 4    Period Months    Status On-going                   Plan - 06/15/21 1203     Clinical Impression Statement Pt presents to clinic reporting improvement with pelvic pain with intrecourse with vaginal penetration but still has some; intermittent pubic pain with period, and continues to have decreased rib mobility and thoracic lumbar impaired flexiblity. Pt reports her period has been regular for 2-3 months now, pt denied internal this session reporting she has had a flare up in her back pain and spasms which has made pelvic pain worse. Pt session foucsed on thoracic and lumbar manual work and stretching, rib mobility manual work with pt reporting greatly improved mobility and decreased pain levels at end of session. Pt tolerated  well and would benefit from continued PT to further address needs.    Personal Factors and Comorbidities  Comorbidity 1;Comorbidity 2;Time since onset of injury/illness/exacerbation;Fitness    Comorbidities open heart surgery when younger, postpartum (3/22)    Examination-Activity Limitations Squat;Stairs;Stand    Examination-Participation Restrictions Other;Interpersonal Relationship    Stability/Clinical Decision Making Stable/Uncomplicated    Rehab Potential Good    PT Frequency 1x / week    PT Duration 12 weeks    PT Treatment/Interventions ADLs/Self Care Home Management;Moist Heat;Electrical Stimulation;Functional mobility training;Therapeutic activities;Therapeutic exercise;Neuromuscular re-education;Manual techniques;Patient/family education;Dry needling;Spinal Manipulations;Passive range of motion;Taping;Energy conservation;Vasopneumatic Device    PT Next Visit Plan internal work for stretching, rib mobility    PT Home Exercise Plan [email or web address] - for pelvic floor. has one for back as well (Access Code: [email or web address])    Consulted and Agree with Plan of Care Patient             Patient will benefit from skilled therapeutic intervention in order to improve the following deficits and impairments:  Decreased range of motion, Decreased strength, Improper body mechanics, Impaired flexibility, Increased muscle spasms, Hypomobility, Postural dysfunction, Pain, Decreased mobility, Increased fascial restricitons  Visit Diagnosis: Muscle weakness (generalized)  Lack of coordination  Abnormality of gait and mobility     Problem List Patient Active Problem List   Diagnosis Date Noted   Atypical chest pain 01/04/2021   Dyspnea on exertion 01/04/2021   Normal labor and delivery 09/30/2020   SVD (spontaneous vaginal delivery) 09/30/2020   Normal postpartum course 09/30/2020   Adjustment disorder with anxious mood 06/08/2020   Palpitations 05/05/2014   Residual ASD (atrial septal defect) following repair 05/05/2014    Otelia Sergeant, PT, DPT 06/16/2211:07 PM   [facility]  Outpatient & Specialty Rehab @ [facility] [address] [place], [place], [address] Phone: [phone]   Fax:  [phone]  Name: Kayla Everett MRN: [id number] Date of Birth: 09/23/1990

## 2021-06-23 ENCOUNTER — Ambulatory Visit: Payer: PRIVATE HEALTH INSURANCE | Admitting: Physical Therapy

## 2021-06-23 ENCOUNTER — Other Ambulatory Visit: Payer: Self-pay

## 2021-06-23 DIAGNOSIS — M6281 Muscle weakness (generalized): Secondary | ICD-10-CM

## 2021-06-23 DIAGNOSIS — R293 Abnormal posture: Secondary | ICD-10-CM

## 2021-06-23 DIAGNOSIS — R252 Cramp and spasm: Secondary | ICD-10-CM

## 2021-06-23 DIAGNOSIS — R269 Unspecified abnormalities of gait and mobility: Secondary | ICD-10-CM

## 2021-06-23 DIAGNOSIS — R279 Unspecified lack of coordination: Secondary | ICD-10-CM

## 2021-06-23 NOTE — Therapy (Signed)
[facility] Out[facility]ty Re[facility] @ [facility] [address] [place], [place], [address] Phone: [phone]   Fax:  [phone]  Physical Therapy Treatment  Patient Details  Name: Kayla Everett MRN: [id number] Date of Birth: 1990/10/05 Referring Provider (PT): Nigel Bridgeman, [place]   Encounter Date: 06/23/2021   PT End of Session - 06/23/21 0848     Visit Number 6    Date for PT Re-Evaluation 09/21/21    Authorization Type Preferred one    Authorization Time Period through 07/02/21    Authorization - Visit Number 10    PT Start Time 0805    PT Stop Time 0843    PT Time Calculation (min) 38 min    Activity Tolerance Patient tolerated treatment well    Behavior During Therapy [facility] for tasks assessed/performed             Past Medical History:  Diagnosis Date   Anxiety    Depression    Heart murmur    Palpitations    Residual ASD (atrial septal defect) following repair june 2012   [facility]    Past Surgical History:  Procedure Laterality Date   ASD REPAIR      There were no vitals filed for this visit.   Subjective Assessment - 06/23/21 0807     Subjective Pt reports some tightness in lower Rt back remains but pt reports she had increased pain with penetration and deeper as well.    Pertinent History lactating, had baby 09/30/20, ASD repair, scoliosis    Limitations Other (comment);Standing;Sitting    Patient Stated Goals loosen up back and decrease pelvic pain, return to intercourse without pain and activity    Currently in Pain? Yes    Pain Score 1     Pain Location Back    Pain Orientation Right;Lower                [facility] PT Assessment - 06/23/21 0001       Assessment   Medical Diagnosis N94.10 (ICD-10-CM) - Unspecified dyspareunia    Referring Provider (PT) Nigel Bridgeman, CNM    Prior Therapy yes recently for back pain and previous pregnancy                           [facility] Adult PT Treatment/Exercise  - 06/23/21 0001       Exercises   Exercises Lumbar;Knee/Hip      Lumbar Exercises: Stretches   Other Lumbar Stretch Exercise standing lateral trunk QL 3x30s each side    Other Lumbar Stretch Exercise seated hip 90/90 2 x1 min each with manual assist at hip joint for overpressure with pt reporting decreased tightness and pain with this      Manual Therapy   Manual Therapy Soft tissue mobilization;Myofascial release    Soft tissue mobilization manual work at Rt lumbar paraspinals with therapist providing soft tissue massage and trigger point release and use of addaday to assist. PT also provided rib expansion in sidelying for improved later. Breathing rib expansion in supine with x10 with manual work for exhale mobility                     PT Education - 06/23/21 0848     Education Details Pt educated on lidocaine lubricant, stretching    Person(s) Educated Patient    Methods Explanation;Demonstration;Tactile cues;Verbal cues    Comprehension Returned demonstration;Verbalized understanding  PT Short Term Goals - 06/23/21 0962       PT SHORT TERM GOAL #1   Title Pt will be ind with initial HEP    Time 5    Period Weeks    Status Achieved    Target Date 04/01/21      PT SHORT TERM GOAL #2   Title Pt do demonstrate improved lifting mechanics to at least 15# with good activation of TA and breathing mechanics.    Baseline 10#    Time 5    Period Weeks    Status On-going    Target Date 04/01/21      PT SHORT TERM GOAL #3   Title pt to demonstrate improved pelvic floor strength to at least 4/5 throughout for improved pelvic stability and decreased compensatory strategies    Time 5    Period Weeks    Status On-going    Target Date 04/01/21      PT SHORT TERM GOAL #4   Title pt to demonstrate improved coordination with core and pelvic floor to contract/relax as needed at least 50% of the time to decrease pelvic pain    Time 5    Period Weeks     Status Achieved    Target Date 04/01/21               PT Long Term Goals - 06/23/21 8366       PT LONG TERM GOAL #1   Title Pt will be ind with advanced HEP and understand how to safely progress    Time 4    Period Months    Status On-going      PT LONG TERM GOAL #2   Title Pt do demonstrate improved lifting mechanics to at least 25# with good activation of TA and breathing mechanics.    Baseline 10#    Time 4    Period Months    Status On-going      PT LONG TERM GOAL #3   Title pt to demonstrate improved pelvic floor strength to at least 5/5 throughout for improved pelvic stability and decreased compensatory strategies    Time 4    Period Months    Status On-going      PT LONG TERM GOAL #4   Title pt to demonstrate improved coordination with core and pelvic floor to contract/relax as needed at least 75% of the time to decrease pelvic pain    Time 4    Period Months    Status On-going      PT LONG TERM GOAL #5   Title pt to demonstrate improved ability to carry at least 20# for functional gait to improve mechanics in lifitng and pelvic/core stability of carring infant and infant needs without pain    Baseline 10#    Time 4    Period Months    Status On-going      PT LONG TERM GOAL #6   Title pt to report no pain with inercourse with vaginal penetration or in deeper muscles for improved QOL.    Time 4    Period Months    Status On-going                   Plan - 06/23/21 0849     Clinical Impression Statement Pt presents to clinic reporting improvement with back pain/thightness after last session had no pain for several days then tightness started mildly at Rt low back and thinks this may be  pulling at her pelvis. Pt attempted to have sex and reporting increased pain with superfical and deep penetration which pt was disapointed by as this had been getting better. Pt re-educated on lidocaine lubricant, stretching prior and deep breathing to relax pelvic  floor. pt agreed and verbalized understanding, pt did deny internal treatment this date and requesting to do this on next session to reassess this. Pt session focused on stretching and manual work at Rt low back for improved pain levels and mobility. Pt reported improvement in mobility and decreased pain at end of session, Pt tolerated well and would benefit from continued PT to further address needs.    Personal Factors and Comorbidities Comorbidity 1;Comorbidity 2;Time since onset of injury/illness/exacerbation;Fitness    Comorbidities open heart surgery when younger, postpartum (3/22)    Examination-Activity Limitations Squat;Stairs;Stand    Examination-Participation Restrictions Other;Interpersonal Relationship    Stability/Clinical Decision Making Stable/Uncomplicated    Rehab Potential Good    PT Frequency 1x / week    PT Duration 12 weeks    PT Treatment/Interventions ADLs/Self Care Home Management;Moist Heat;Electrical Stimulation;Functional mobility training;Therapeutic activities;Therapeutic exercise;Neuromuscular re-education;Manual techniques;Patient/family education;Dry needling;Spinal Manipulations;Passive range of motion;Taping;Energy conservation;Vasopneumatic Device    PT Next Visit Plan internal work for stretching, rib mobility    PT Home Exercise Plan [email or web address] - for pelvic floor. has one for back as well (Access Code: [email or web address])    Consulted and Agree with Plan of Care Patient             Patient will benefit from skilled therapeutic intervention in order to improve the following deficits and impairments:  Decreased range of motion, Decreased strength, Improper body mechanics, Impaired flexibility, Increased muscle spasms, Hypomobility, Postural dysfunction, Pain, Decreased mobility, Increased fascial restricitons  Visit Diagnosis: Muscle weakness (generalized) - Plan: PT plan of care cert/re-cert  Lack of coordination - Plan: PT plan of care cert/re-cert  Cramp and  spasm - Plan: PT plan of care cert/re-cert  Abnormal posture - Plan: PT plan of care cert/re-cert  Abnormality of gait and mobility - Plan: PT plan of care cert/re-cert     Problem List Patient Active Problem List   Diagnosis Date Noted   Atypical chest pain 01/04/2021   Dyspnea on exertion 01/04/2021   Normal labor and delivery 09/30/2020   SVD (spontaneous vaginal delivery) 09/30/2020   Normal postpartum course 09/30/2020   Adjustment disorder with anxious mood 06/08/2020   Palpitations 05/05/2014   Residual ASD (atrial septal defect) following repair 05/05/2014    Otelia Sergeant, PT, DPT 12/22/228:55 AM   [facility] Out[facility] Re[facility] @ [facility] [address] [place], [place], [address] Phone: [phone]   Fax:  [phone]  Name: Kayla Everett MRN: [id number] Date of Birth: Oct 15, 1990

## 2021-07-08 ENCOUNTER — Ambulatory Visit: Payer: PRIVATE HEALTH INSURANCE | Admitting: Psychology

## 2021-08-03 ENCOUNTER — Other Ambulatory Visit: Payer: Self-pay

## 2021-08-03 ENCOUNTER — Ambulatory Visit: Payer: 59 | Attending: Obstetrics and Gynecology | Admitting: Physical Therapy

## 2021-08-03 DIAGNOSIS — M6281 Muscle weakness (generalized): Secondary | ICD-10-CM | POA: Insufficient documentation

## 2021-08-03 DIAGNOSIS — R252 Cramp and spasm: Secondary | ICD-10-CM | POA: Insufficient documentation

## 2021-08-03 NOTE — Therapy (Addendum)
[facility] [place], [place], [address] Phone: [phone]   Fax:  [phone]  Physical Therapy Treatment  Patient Details  Name: Kayla Everett MRN: [id number] Date of Birth: 1991/01/21 Referring Provider (PT): Donnel Saxon, [place]   Encounter Date: 08/03/2021   PT End of Session - 08/03/21 1705     Visit Number 7    Date for PT Re-Evaluation 09/21/21    Authorization Type Preferred one    Authorization Time Period through 07/02/21    Authorization - Visit Number 10    PT Start Time 1448    PT Stop Time 1700    PT Time Calculation (min) 39 min    Activity Tolerance Patient tolerated treatment well    Behavior During Therapy [facility] for tasks assessed/performed             Past Medical History:  Diagnosis Date   Anxiety    Depression    Heart murmur    Palpitations    Residual ASD (atrial septal defect) following repair june 2012   [facility]    Past Surgical History:  Procedure Laterality Date   ASD REPAIR      There were no vitals filed for this visit.   Subjective Assessment - 08/03/21 1704     Subjective Pt reports she only has pain with penetration for intercourse position pending; sometimes has Rt SIJ pain and paraspinal pain    Pertinent History lactating, had baby 09/30/20, ASD repair, scoliosis    Limitations Other (comment);Standing;Sitting    Patient Stated Goals loosen up back and decrease pelvic pain, return to intercourse without pain and activity    Currently in Pain? Yes    Pain Score 1     Pain Location Back    Pain Orientation Right;Lower    Pain Descriptors / Indicators Aching                               [facility] Adult PT Treatment/Exercise - 08/03/21 0001       Exercises   Exercises Lumbar;Knee/Hip      Lumbar Exercises: Stretches   Active Hamstring Stretch Right;Left;1 rep;30 seconds    ITB Stretch Right;Left;1 rep;30 seconds    Piriformis  Stretch Left;Right;1 rep;30 seconds    Other Lumbar Stretch Exercise hip 90/90 2 x30s each with manual assist at hip joint for overpressure with pt reporting decreased tightness reported by pt      Lumbar Exercises: Quadruped   Madcat/Old Horse 10 reps    Other Quadruped Lumbar Exercises half kneel hip flexion stretch with lateral trunk lean      Manual Therapy   Manual Therapy Soft tissue mobilization;Myofascial release    Soft tissue mobilization manual work at [employer] lumbar paraspinals with therapist providing soft tissue massage and trigger point release.                     PT Education - 08/03/21 1702     Education Details Pt educated on vaginal moisturizer as she is still have dryness with nursing as pt reports this leads to some more pain with penetration    Person(s) Educated Patient    Methods Explanation;Demonstration;Verbal cues;Tactile cues    Comprehension Returned demonstration;Verbalized understanding              PT Short Term Goals - 06/23/21 1856       PT  SHORT TERM GOAL #1   Title Pt will be ind with initial HEP    Time 5    Period Weeks    Status Achieved    Target Date 04/01/21      PT SHORT TERM GOAL #2   Title Pt do demonstrate improved lifting mechanics to at least 15# with good activation of TA and breathing mechanics.    Baseline 10#    Time 5    Period Weeks    Status On-going    Target Date 04/01/21      PT SHORT TERM GOAL #3   Title pt to demonstrate improved pelvic floor strength to at least 4/5 throughout for improved pelvic stability and decreased compensatory strategies    Time 5    Period Weeks    Status On-going    Target Date 04/01/21      PT SHORT TERM GOAL #4   Title pt to demonstrate improved coordination with core and pelvic floor to contract/relax as needed at least 50% of the time to decrease pelvic pain    Time 5    Period Weeks    Status Achieved    Target Date 04/01/21               PT Long Term  Goals - 06/23/21 4431       PT LONG TERM GOAL #1   Title Pt will be ind with advanced HEP and understand how to safely progress    Time 4    Period Months    Status On-going      PT LONG TERM GOAL #2   Title Pt do demonstrate improved lifting mechanics to at least 25# with good activation of TA and breathing mechanics.    Baseline 10#    Time 4    Period Months    Status On-going      PT LONG TERM GOAL #3   Title pt to demonstrate improved pelvic floor strength to at least 5/5 throughout for improved pelvic stability and decreased compensatory strategies    Time 4    Period Months    Status On-going      PT LONG TERM GOAL #4   Title pt to demonstrate improved coordination with core and pelvic floor to contract/relax as needed at least 75% of the time to decrease pelvic pain    Time 4    Period Months    Status On-going      PT LONG TERM GOAL #5   Title pt to demonstrate improved ability to carry at least 20# for functional gait to improve mechanics in lifitng and pelvic/core stability of carring infant and infant needs without pain    Baseline 10#    Time 4    Period Months    Status On-going      PT LONG TERM GOAL #6   Title pt to report no pain with inercourse with vaginal penetration or in deeper muscles for improved QOL.    Time 4    Period Months    Status On-going                   Plan - 08/03/21 1706     Clinical Impression Statement Pt presents to clinic reporting improvement with pelvic pain overall and only having pain with penetration, does use lubricant but no moisturizer. Pt is still having Rt side lumbar paraspinals and intermittently thoracic as well. pt session focused on hip, core, and spinal stretching and  manual work on lumbar paraspinal for improved pain level and decreased tightness with good effect. Pt agreeable to internal treatment next session as "I'm not prepared for that today". Pt tolerated well and reported improvement at end of  session and would benefit from continued PT to further address needs.    Personal Factors and Comorbidities Comorbidity 1;Comorbidity 2;Time since onset of injury/illness/exacerbation;Fitness    Comorbidities open heart surgery when younger, postpartum (3/22)    Examination-Activity Limitations Squat;Stairs;Stand    Examination-Participation Restrictions Other;Interpersonal Relationship    Stability/Clinical Decision Making Stable/Uncomplicated    Rehab Potential Good    PT Frequency 1x / week    PT Duration 12 weeks    PT Treatment/Interventions ADLs/Self Care Home Management;Moist Heat;Electrical Stimulation;Functional mobility training;Therapeutic activities;Therapeutic exercise;Neuromuscular re-education;Manual techniques;Patient/family education;Dry needling;Spinal Manipulations;Passive range of motion;Taping;Energy conservation;Vasopneumatic Device    PT Next Visit Plan internal work    [facility] - for pelvic floor. has one for back as well (Access Code: [email or web address])    Consulted and Agree with Plan of Care Patient             Patient will benefit from skilled therapeutic intervention in order to improve the following deficits and impairments:  Decreased range of motion, Decreased strength, Improper body mechanics, Impaired flexibility, Increased muscle spasms, Hypomobility, Postural dysfunction, Pain, Decreased mobility, Increased fascial restricitons  Visit Diagnosis: Muscle weakness (generalized)  Cramp and spasm     Problem List Patient Active Problem List   Diagnosis Date Noted   Atypical chest pain 01/04/2021   Dyspnea on exertion 01/04/2021   Normal labor and delivery 09/30/2020   SVD (spontaneous vaginal delivery) 09/30/2020   Normal postpartum course 09/30/2020   Adjustment disorder with anxious mood 06/08/2020   Palpitations 05/05/2014   Residual ASD (atrial septal defect) following repair 05/05/2014    Stacy Gardner, PT, DPT 02/01/235:09  PM     PHYSICAL THERAPY DISCHARGE SUMMARY  Visits from Start of Care: 7  Current functional level related to goals / functional outcomes: Unable to formally reassess as pt called to be discharged from PT.    Remaining deficits: Unable to formally reassess as pt requested to be Discharged   Education / Equipment:HEP   Patient agrees to discharge. Patient goals were partially met. Patient is being discharged due to the patient's request.   [facility] @ [facility] [place], [place], [address] Phone: [phone]   Fax:  [phone]  Name: DALIAH CHAUDOIN MRN: [id number] Date of Birth: 01-01-1991

## 2021-08-08 ENCOUNTER — Ambulatory Visit: Payer: PRIVATE HEALTH INSURANCE | Admitting: Psychology

## 2021-08-11 ENCOUNTER — Encounter: Payer: PRIVATE HEALTH INSURANCE | Admitting: Physical Therapy

## 2021-08-25 ENCOUNTER — Encounter: Payer: Self-pay | Admitting: Physical Therapy

## 2021-09-15 ENCOUNTER — Ambulatory Visit: Payer: PRIVATE HEALTH INSURANCE | Admitting: Physical Therapy

## 2021-11-11 NOTE — Progress Notes (Signed)
?Terrilee Files D.O. ?[facility] ?[address] [place] [address] ?Phone: [phone] ?Subjective:   ?I, Wilford Grist, am serving as a scribe for Dr. Antoine Primas. ? ?This visit occurred during the SARS-CoV-2 public health emergency.  Safety protocols were in place, including screening questions prior to the visit, additional usage of staff PPE, and extensive cleaning of exam room while observing appropriate contact time as indicated for disinfecting solutions.  ? ? ?I'm seeing this patient by the request  of:  Olive Bass, FNP ? ?CC: Back pain follow-up ? ?OQH:[id number]  ?OLUWADEMILADE KELLETT is a 31 y.o. female coming in with complaint of back pain. Patient states that her pain has been occurring for years over R thoracic spine pain in intercostal muscles. Patient has done chiro, PT. Stretching is helpful but pain immediately comes back. Has weeks or months without pain but pain may not go away for weeks or months. Patient had open heart surgery several years ago. Patient has little movement in rib cage since surgery. Had a baby last year as well. Uses IBU prn QHS.  ? ? ? ?  ? ?Past Medical History:  ?Diagnosis Date  ? Anxiety   ? Depression   ? Heart murmur   ? Palpitations   ? Residual ASD (atrial septal defect) following repair june 2012  ? [facility]  ? ?Past Surgical History:  ?Procedure Laterality Date  ? ASD REPAIR    ? ?Social History  ? ?Socioeconomic History  ? Marital status: Married  ?  Spouse name: Not on file  ? Number of children: Not on file  ? Years of education: Not on file  ? Highest education level: Not on file  ?Occupational History  ? Not on file  ?Tobacco Use  ? Smoking status: Never  ? Smokeless tobacco: Never  ?Vaping Use  ? Vaping Use: Former  ?Substance and Sexual Activity  ? Alcohol use: No  ?  Alcohol/week: 0.0 standard drinks  ? Drug use: No  ? Sexual activity: Not on file  ?Other Topics Concern  ? Not on file  ?Social History Narrative  ? Not on file   ? ?Social Determinants of Health  ? ?Financial Resource Strain: Not on file  ?Food Insecurity: Not on file  ?Transportation Needs: Not on file  ?Physical Activity: Not on file  ?Stress: Not on file  ?Social Connections: Not on file  ? ?No Known Allergies ?Family History  ?Problem Relation Age of Onset  ? Hyperlipidemia Father   ? Heart disease Father   ? Hyperlipidemia Paternal Grandfather   ? Heart disease Paternal Grandfather   ? ? ? ?Current Outpatient Medications (Cardiovascular):  ?  propranolol (INDERAL) 10 MG tablet, Take 1 tablet (10 mg total) by mouth 2 (two) times daily as needed (anxiety). ? ?Current Outpatient Medications (Respiratory):  ?  fluticasone (FLONASE) 50 MCG/ACT nasal spray, Place 1 spray into both nostrils daily. ? ?Current Outpatient Medications (Analgesics):  ?  butalbital-acetaminophen-caffeine (FIORICET) 50-325-40 MG tablet, Take 1 tablet by mouth every 4 (four) hours as needed for headache. ?  ibuprofen (ADVIL) 600 MG tablet, Take 1 tablet (600 mg total) by mouth every 6 (six) hours. ? ? ?Current Outpatient Medications (Other):  ?  Cholecalciferol (VITAMIN D) 50 MCG (2000 UT) tablet, Take 2,000 Units by mouth daily. ?  magnesium oxide (MAG-OX) 400 MG tablet, Take 400 mg by mouth daily. ?  Prenatal Vit-Fe Fumarate-FA (PRENATAL MULTIVITAMIN) TABS tablet, Take 1 tablet by mouth daily  at 12 noon. ?  Vitamin D, Ergocalciferol, (DRISDOL) 1.25 MG (50000 UNIT) CAPS capsule, Take 1 capsule (50,000 Units total) by mouth every 7 (seven) days. ? ? ?Reviewed prior external information including notes and imaging from  ?primary care provider ?As well as notes that were available from care everywhere and other healthcare systems. ? ?Past medical history, social, surgical and family history all reviewed in electronic medical record.  No pertanent information unless stated regarding to the chief complaint.  ? ?Review of Systems: ? No headache, visual changes, nausea, vomiting, diarrhea, constipation,  dizziness, abdominal pain, skin rash, fevers, chills, night sweats, weight loss, swollen lymph nodes, body aches, joint swelling, chest pain, shortness of breath, mood changes. POSITIVE muscle aches ? ?Objective  ?Blood pressure 120/72, pulse 72, height 5\' 2"  (1.575 m), weight 112 lb (50.8 kg), SpO2 98 %, currently breastfeeding. ?  ?General: No apparent distress alert and oriented x3 mood and affect normal, dressed appropriately.  ?HEENT: Pupils equal, extraocular movements intact  ?Respiratory: Patient's speak in full sentences and does not appear short of breath  ?Cardiovascular: No lower extremity edema, non tender, no erythema  ?Gait normal with good balance and coordination.  ?MSK: Low back exam does have some mild loss of lordosis. ?Patient does have tightness noted as well.  Mild tightness noted with left greater than right.  Patient does have a leg length discrepancy noted on the left side being shorter by approximately quarter of an inch ? ?Osteopathic findings ?C7 flexed rotated and side bent right ?T4 extended rotated and side bent right  ?L1 flexed rotated and side bent right ?Sacrum left on left ?Pelvic shear noted ? ?97110; 15 additional minutes spent for Therapeutic exercises as stated in above notes.  This included exercises focusing on stretching, strengthening, with significant focus on eccentric aspects.   Long term goals include an improvement in range of motion, strength, endurance as well as avoiding reinjury. Patient's frequency would include in 1-2 times a day, 3-5 times a week for a duration of 6-12 weeks. Sacroiliac Joint Mobilization and Rehab ?1. Work on pretzel stretching, shoulder back and leg draped in front. 3-5 sets, 30 sec.Pearlean Brownie ?2. hip abductor rotations. standing, hip flexion and rotation outward then inward. 3 sets, 15 reps. when can do comfortably, add ankle weights starting at 2 pounds.  ?3. cross over stretching - shoulder back to ground, same side leg crossover. 3-5 sets  for 30 min..  ?4. rolling up and back knees to chest and rocking. ?5. sacral tilt - 5 sets, hold for 5-10 seconds ?  Proper technique shown and discussed handout in great detail with ATC.  All questions were discussed and answered.  ? ?  ?Impression and Recommendations:  ?  ? ?The above documentation has been reviewed and is accurate and complete Marland Kitchen, DO ? ? ? ?

## 2021-11-15 ENCOUNTER — Encounter: Payer: Self-pay | Admitting: Family Medicine

## 2021-11-15 ENCOUNTER — Ambulatory Visit (INDEPENDENT_AMBULATORY_CARE_PROVIDER_SITE_OTHER): Payer: 59 | Admitting: Family Medicine

## 2021-11-15 VITALS — BP 120/72 | HR 72 | Ht 62.0 in | Wt 112.0 lb

## 2021-11-15 DIAGNOSIS — M9905 Segmental and somatic dysfunction of pelvic region: Secondary | ICD-10-CM | POA: Diagnosis not present

## 2021-11-15 DIAGNOSIS — M9901 Segmental and somatic dysfunction of cervical region: Secondary | ICD-10-CM

## 2021-11-15 DIAGNOSIS — M9903 Segmental and somatic dysfunction of lumbar region: Secondary | ICD-10-CM | POA: Diagnosis not present

## 2021-11-15 DIAGNOSIS — M533 Sacrococcygeal disorders, not elsewhere classified: Secondary | ICD-10-CM | POA: Diagnosis not present

## 2021-11-15 DIAGNOSIS — M9902 Segmental and somatic dysfunction of thoracic region: Secondary | ICD-10-CM

## 2021-11-15 DIAGNOSIS — M9904 Segmental and somatic dysfunction of sacral region: Secondary | ICD-10-CM

## 2021-11-15 MED ORDER — VITAMIN D (ERGOCALCIFEROL) 1.25 MG (50000 UNIT) PO CAPS: 50000.0000 [IU] | ORAL_CAPSULE | ORAL | 0 refills | Status: DC

## 2021-11-15 NOTE — Assessment & Plan Note (Signed)
Left-sided sacroiliac dysfunction.  Patient is already working with a pelvic floor specialist.  Patient has made some strides but continues to also have a leg length discrepancy with the left side being shorter than I think is contributing to potentially some malalignment as well.  We discussed which activities to do and which ones to avoid, we discussed hip abductor strengthening.  Discussed once weekly vitamin D.  We will consider the possibility of x-rays and labs which patient declined today.  Depending on how patient responds will reconsider.  ?

## 2021-11-15 NOTE — Assessment & Plan Note (Addendum)
   Decision today to treat with OMT was based on Physical Exam  After verbal consent patient was treated with HVLA, ME, FPR techniques in cervical, thoracic, pelvis, lumbar and sacral areas, all areas are chronic   Patient tolerated the procedure well with improvement in symptoms  Patient given exercises, stretches and lifestyle modifications  See medications in patient instructions if given  Patient will follow up in 4-8 weeks 

## 2021-11-15 NOTE — Patient Instructions (Signed)
Once weekly Vit D ?Heel lift ?Exercises ?See me in 5-6 weeks ?

## 2021-12-30 ENCOUNTER — Ambulatory Visit: Payer: PRIVATE HEALTH INSURANCE | Admitting: Family Medicine

## 2022-01-30 ENCOUNTER — Ambulatory Visit: Payer: PRIVATE HEALTH INSURANCE | Admitting: Family Medicine

## 2022-07-03 NOTE — L&D Delivery Note (Signed)
   Delivery Note:   G2P1001 at [redacted]w[redacted]d  Admitting diagnosis: Encounter for induction of labor [Z34.90] Risks: Rh negative, s/p Rhogam at 28 weeks Onset of labor: 04/02/2023 at 1635 IOL/Augmentation: AROM, Cytotec, and IP Foley ROM: 04/02/2023 at 1840, scant fluid  Complete dilation at 04/02/2023 1910 Onset of pushing at 1915 FHR second stage Cat II, variables   Analgesia/Anesthesia intrapartum:None Pushing in right side lying position with CNM and L&D staff support, Husband, [name], and doula, [name], present for birth and supportive.  Delivery of a Live born female  Birth Weight:  pending APGAR: 7, 9   Newborn Delivery   Birth date/time: 04/02/2023 19:19:00 Delivery type: Vaginal, Spontaneous    in cephalic presentation, position OA to LOA.  APGAR:1 min-7 , 5 min-9   Nuchal Cord: Yes  x 1, reduced on perineum Cord double clamped after cessation of pulsation, cut by patient.  Collection of cord blood for typing completed. Arterial cord blood sample-No   Placenta delivered-Spontaneous with 3 vessels. Uterotonics: None Placenta to L&D Uterine tone firm  Bleeding scant  None laceration identified.  Episiotomy:None Local analgesia: N/A  Repair: N/A Est. Blood Loss (mL):44.00  Complications: None  Mom to postpartum. Baby Kayla Everett to Couplet care / Skin to Skin.  Delivery Report:   Review the Delivery Report for details.    June Leap, CNM, MSN 04/02/2023, 7:40 PM

## 2022-09-12 LAB — OB RESULTS CONSOLE HEPATITIS B SURFACE ANTIGEN: Hepatitis B Surface Ag: NEGATIVE

## 2022-09-12 LAB — OB RESULTS CONSOLE GC/CHLAMYDIA
Chlamydia: NEGATIVE
Neisseria Gonorrhea: NEGATIVE

## 2022-09-12 LAB — OB RESULTS CONSOLE RUBELLA ANTIBODY, IGM: Rubella: IMMUNE

## 2022-09-12 LAB — OB RESULTS CONSOLE RPR: RPR: NONREACTIVE

## 2022-09-12 LAB — HEPATITIS C ANTIBODY: HCV Ab: NEGATIVE

## 2022-09-12 LAB — OB RESULTS CONSOLE HIV ANTIBODY (ROUTINE TESTING): HIV: NONREACTIVE

## 2022-12-05 ENCOUNTER — Telehealth: Payer: Self-pay | Admitting: Cardiovascular Disease

## 2022-12-05 DIAGNOSIS — Q211 Atrial septal defect, unspecified: Secondary | ICD-10-CM

## 2022-12-05 NOTE — Telephone Encounter (Signed)
Last echo was done at 32/[redacted] week gestation.  She is currently 24 weeks. So asking in order to be scheduled at appropriate time frame.

## 2022-12-05 NOTE — Telephone Encounter (Signed)
Patient states she has a history of ASD repair during her last pregnancy and had an echo and everything was fine. She is pregnant again and would like to know if Dr. Allyson Sabal is able to order an echo.

## 2022-12-05 NOTE — Telephone Encounter (Signed)
LVM that Dr Allyson Sabal agreed to echo.  Ordered and that scheduling will call her to schedule.

## 2023-02-05 ENCOUNTER — Other Ambulatory Visit (HOSPITAL_COMMUNITY): Payer: PRIVATE HEALTH INSURANCE

## 2023-02-20 ENCOUNTER — Ambulatory Visit (HOSPITAL_COMMUNITY): Payer: Commercial Managed Care - PPO | Attending: Cardiovascular Disease

## 2023-02-20 ENCOUNTER — Ambulatory Visit (HOSPITAL_COMMUNITY): Payer: PRIVATE HEALTH INSURANCE

## 2023-02-20 DIAGNOSIS — Q211 Atrial septal defect, unspecified: Secondary | ICD-10-CM | POA: Diagnosis not present

## 2023-02-20 LAB — ECHOCARDIOGRAM COMPLETE
Area-P 1/2: 3.08 cm2
S' Lateral: 2.8 cm

## 2023-02-21 ENCOUNTER — Other Ambulatory Visit: Payer: Self-pay | Admitting: *Deleted

## 2023-02-21 ENCOUNTER — Telehealth: Payer: Self-pay | Admitting: *Deleted

## 2023-02-21 DIAGNOSIS — Q211 Atrial septal defect, unspecified: Secondary | ICD-10-CM

## 2023-02-21 DIAGNOSIS — I059 Rheumatic mitral valve disease, unspecified: Secondary | ICD-10-CM

## 2023-02-21 NOTE — Telephone Encounter (Signed)
Spoke with pt, she saw her echo results on my chart. She is pregnant and is wanting to make sure there is nothing to worry about with delivery. She is also wondering if she needs to be seen. Aware will forward to dr berry.

## 2023-02-22 NOTE — Telephone Encounter (Signed)
Message from MD sent to patient via MyChart

## 2023-02-22 NOTE — Telephone Encounter (Signed)
Runell Gess, MD  Freddi Starr, RN1 hour ago (7:29 AM)    There is nothing on 2D that I'm concerned about that would affect her pregnancy or delivery

## 2023-02-23 NOTE — Addendum Note (Signed)
Addended by: Lindell Spar on: 02/23/2023 03:27 PM   Modules accepted: Orders

## 2023-02-26 NOTE — Telephone Encounter (Signed)
Left message:  Called to inform her that we were able to get her scheduled with Dr Servando Salina for Friday 03/02/23 at 1600.

## 2023-03-01 LAB — OB RESULTS CONSOLE GBS: GBS: NEGATIVE

## 2023-03-02 ENCOUNTER — Ambulatory Visit: Payer: PRIVATE HEALTH INSURANCE | Attending: Cardiology | Admitting: Cardiology

## 2023-03-02 ENCOUNTER — Encounter: Payer: Self-pay | Admitting: Cardiology

## 2023-03-02 VITALS — BP 108/76 | HR 68 | Ht 62.0 in | Wt 140.6 lb

## 2023-03-02 DIAGNOSIS — Z8774 Personal history of (corrected) congenital malformations of heart and circulatory system: Secondary | ICD-10-CM | POA: Diagnosis not present

## 2023-03-02 DIAGNOSIS — Z3A35 35 weeks gestation of pregnancy: Secondary | ICD-10-CM

## 2023-03-02 DIAGNOSIS — I059 Rheumatic mitral valve disease, unspecified: Secondary | ICD-10-CM

## 2023-03-02 DIAGNOSIS — I071 Rheumatic tricuspid insufficiency: Secondary | ICD-10-CM

## 2023-03-02 NOTE — Progress Notes (Signed)
Cardio-Obstetrics Clinic  New Evaluation   Date:  03/02/2023   ID:  Kayla Everett, DOB [birth year], MRN [id number]  PCP:  Macy Mis, MD   [facility] Providers Cardiologist:  Nanetta Batty, MD  Electrophysiologist:  None        Referring MD: Macy Mis, MD   Chief Complaint: " I am ok"   History of Present Illness:    Kayla Everett is a 32 y.o. female [G2P1001] who returns for follow up of ASD s/p ASD closure in 2012/psychologist note that this is an exertion and was worked up and was found to have ASD, mild to moderate TR and mild to to moderate mitral regurgitation. She was refer to the cardio-ob patient by Dr. Allyson Sabal.   She is currently 35 weeks and 6 days pregnant with her second child.  Her first child her pregnancy was uneventful.  She underwent a vaginal delivery.  She was sent to the cardio OB clinic after her echocardiogram showed mild to moderate mitral regurgitation and mild to moderate tricuspid regurgitation.  She is here with her husband today she offers no complaints.  She had several questions about her regurgitant lesions during pregnancy as well as about her plans for delivery.  She is interested in undergoing water birth at the [facility].   Prior CV Studies Reviewed: The following studies were reviewed today: Echo reviewed   Past Medical History:  Diagnosis Date   Anxiety    Depression    Heart murmur    Palpitations    Residual ASD (atrial septal defect) following repair june 2012   [facility]    Past Surgical History:  Procedure Laterality Date   ASD REPAIR        OB History     Gravida  2   Para  1   Term  1   Preterm      AB      Living  1      SAB      IAB      Ectopic      Multiple  0   Live Births  1               Current Medications: Current Meds  Medication Sig   magnesium oxide (MAG-OX) 400 MG tablet Take 400 mg by mouth daily.   Prenatal Vit-Fe Fumarate-FA (PRENATAL  MULTIVITAMIN) TABS tablet Take 1 tablet by mouth daily at 12 noon.     Allergies:   Patient has no known allergies.   Social History   Socioeconomic History   Marital status: Married    Spouse name: Not on file   Number of children: Not on file   Years of education: Not on file   Highest education level: Not on file  Occupational History   Not on file  Tobacco Use   Smoking status: Never   Smokeless tobacco: Never  Vaping Use   Vaping status: Former  Substance and Sexual Activity   Alcohol use: No    Alcohol/week: 0.0 standard drinks of alcohol   Drug use: No   Sexual activity: Not on file  Other Topics Concern   Not on file  Social History Narrative   Not on file   Social Determinants of Health   Financial Resource Strain: Not on file  Food Insecurity: No Food Insecurity (10/10/2021)   Received from [facility]   Hunger Vital Sign    Worried About Running Out of Food  in the Last Year: Never true    Ran Out of Food in the Last Year: Never true  Transportation Needs: Not on file  Physical Activity: Not on file  Stress: Not on file  Social Connections: Unknown (11/02/2021)   Received from [facility]   Social Network    Social Network: Not on file      Family History  Problem Relation Age of Onset   Hyperlipidemia Father    Heart disease Father    Hyperlipidemia Paternal Grandfather    Heart disease Paternal Grandfather       ROS:   Please see the history of present illness.     All other systems reviewed and are negative.   Labs/EKG Reviewed:    EKG:   EKG was ordered today.  The ekg ordered today demonstrates sinus rhythm, heart rate 60 bpm  Recent Labs: No results found for requested labs within last 365 days.   Recent Lipid Panel No results found for: "CHOL", "TRIG", "HDL", "CHOLHDL", "LDLCALC", "LDLDIRECT"  Physical Exam:    VS:  BP 108/76 (BP Location: Left Arm, Patient Position: Sitting, Cuff Size: Normal)   Pulse 68   Ht 5\' 2"  (1.575  m)   Wt 140 lb 9.6 oz (63.8 kg)   LMP  (LMP Unknown)   SpO2 96%   Breastfeeding No   BMI 25.72 kg/m     Wt Readings from Last 3 Encounters:  03/02/23 140 lb 9.6 oz (63.8 kg)  11/15/21 112 lb (50.8 kg)  04/12/21 110 lb 6.4 oz (50.1 kg)     GEN:  Well nourished, well developed in no acute distress HEENT: Normal NECK: No JVD; No carotid bruits LYMPHATICS: No lymphadenopathy CARDIAC: RRR, no murmurs, rubs, gallops RESPIRATORY:  Clear to auscultation without rales, wheezing or rhonchi  ABDOMEN: Soft, non-tender, non-distended MUSCULOSKELETAL:  No edema; No deformity  SKIN: Warm and dry NEUROLOGIC:  Alert and oriented x 3 PSYCHIATRIC:  Normal affect    Risk Assessment/Risk Calculators:     CARPREG II Risk Prediction Index Score:  1.  The patient's risk for a primary cardiac event is 5%.            ASSESSMENT & PLAN:    Status post ASD closure Mild to moderate tricuspid regurgitation Mild to moderate mitral regurgitation [redacted] weeks gestation  She is WHO class I, with a CARPREG II score of 1.  Noted no significant risk for pregnancy.  Clinically she does not appear to be fluid overloaded.  No lower extremity edema/no shortness of breath/no orthopnea.  At this time there is no need for any medicinal intervention.  She can tolerate vaginal delivery unless C-section is indicated due to obstetrical reasons.  She would benefit from short course of diuretics in the immediate postpartum period.  Lasix 20 mg for 3 to 5 days will be appropriate.  She is planning to breast-feed I think this is really good for the baby as well.  I have encouraged the patient to buy blood pressure cuff/Omron automated blood pressure cuff to be able to monitor her blood pressure postpartum.  All of her questions in the office were answered to her satisfaction.  She left the office with her husband in stable condition.  She will follow-up in 8 weeks  Patient Instructions  Medication Instructions:    No changes  *If you need a refill on your cardiac medications before your next appointment, please call your pharmacy*   Lab Work:  Not needed  Testing/Procedures:  Not needed  Follow-Up: At [facility], you and your health needs are our priority.  As part of our continuing mission to provide you with exceptional heart care, we have created designated Provider Care Teams.  These Care Teams include your primary Cardiologist (physician) and Advanced Practice Providers (APPs -  Physician Assistants and Nurse Practitioners) who all work together to provide you with the care you need, when you need it.     Your next appointment:   8 week(s)  The format for your next appointment:   In Person  Provider:   Dr Thomasene Ripple    Other Instructions     Please purchase a blood pressure cuff  - ( omron)  Post aprtum take blood pressure daily  cal office if b/p >140/90   Dispo:  Return in about 8 weeks (around 04/27/2023).   Medication Adjustments/Labs and Tests Ordered: Current medicines are reviewed at length with the patient today.  Concerns regarding medicines are outlined above.  Tests Ordered: Orders Placed This Encounter  Procedures   EKG 12-Lead   Medication Changes: No orders of the defined types were placed in this encounter.

## 2023-03-02 NOTE — Patient Instructions (Signed)
Medication Instructions:   No changes  *If you need a refill on your cardiac medications before your next appointment, please call your pharmacy*   Lab Work:  Not needed   Testing/Procedures:  Not needed  Follow-Up: At [facility], you and your health needs are our priority.  As part of our continuing mission to provide you with exceptional heart care, we have created designated Provider Care Teams.  These Care Teams include your primary Cardiologist (physician) and Advanced Practice Providers (APPs -  Physician Assistants and Nurse Practitioners) who all work together to provide you with the care you need, when you need it.     Your next appointment:   8 week(s)  The format for your next appointment:   In Person  Provider:   Dr Thomasene Ripple    Other Instructions     Please purchase a blood pressure cuff  - ( omron)  Post aprtum take blood pressure daily  cal office if b/p >140/90

## 2023-04-02 ENCOUNTER — Encounter (HOSPITAL_COMMUNITY): Payer: Self-pay | Admitting: Obstetrics and Gynecology

## 2023-04-02 ENCOUNTER — Inpatient Hospital Stay (HOSPITAL_COMMUNITY)
Admission: AD | Admit: 2023-04-02 | Discharge: 2023-04-04 | DRG: 805 | Disposition: A | Discharge status: Home / Self Care | Payer: Commercial Managed Care - PPO | Attending: Obstetrics and Gynecology | Admitting: Obstetrics and Gynecology

## 2023-04-02 DIAGNOSIS — O26893 Other specified pregnancy related conditions, third trimester: Secondary | ICD-10-CM | POA: Diagnosis present

## 2023-04-02 DIAGNOSIS — O480 Post-term pregnancy: Secondary | ICD-10-CM | POA: Diagnosis present

## 2023-04-02 DIAGNOSIS — Z23 Encounter for immunization: Secondary | ICD-10-CM | POA: Diagnosis not present

## 2023-04-02 DIAGNOSIS — Z6791 Unspecified blood type, Rh negative: Secondary | ICD-10-CM | POA: Diagnosis not present

## 2023-04-02 DIAGNOSIS — Z83438 Family history of other disorder of lipoprotein metabolism and other lipidemia: Secondary | ICD-10-CM

## 2023-04-02 DIAGNOSIS — Z8249 Family history of ischemic heart disease and other diseases of the circulatory system: Secondary | ICD-10-CM | POA: Diagnosis not present

## 2023-04-02 DIAGNOSIS — I081 Rheumatic disorders of both mitral and tricuspid valves: Secondary | ICD-10-CM | POA: Diagnosis present

## 2023-04-02 DIAGNOSIS — Z8774 Personal history of (corrected) congenital malformations of heart and circulatory system: Secondary | ICD-10-CM | POA: Diagnosis not present

## 2023-04-02 DIAGNOSIS — O9942 Diseases of the circulatory system complicating childbirth: Secondary | ICD-10-CM | POA: Diagnosis present

## 2023-04-02 DIAGNOSIS — Z3A40 40 weeks gestation of pregnancy: Secondary | ICD-10-CM | POA: Diagnosis not present

## 2023-04-02 DIAGNOSIS — I340 Nonrheumatic mitral (valve) insufficiency: Secondary | ICD-10-CM

## 2023-04-02 DIAGNOSIS — O4103X0 Oligohydramnios, third trimester, not applicable or unspecified: Secondary | ICD-10-CM | POA: Diagnosis present

## 2023-04-02 DIAGNOSIS — O26899 Other specified pregnancy related conditions, unspecified trimester: Secondary | ICD-10-CM

## 2023-04-02 DIAGNOSIS — O99413 Diseases of the circulatory system complicating pregnancy, third trimester: Secondary | ICD-10-CM

## 2023-04-02 DIAGNOSIS — Z3490 Encounter for supervision of normal pregnancy, unspecified, unspecified trimester: Secondary | ICD-10-CM | POA: Diagnosis present

## 2023-04-02 LAB — CBC
HCT: 40.3 % (ref 36.0–46.0)
Hemoglobin: 14.1 g/dL (ref 12.0–15.0)
MCH: 32.6 pg (ref 26.0–34.0)
MCHC: 35 g/dL (ref 30.0–36.0)
MCV: 93.1 fL (ref 80.0–100.0)
Platelets: 174 10*3/uL (ref 150–400)
RBC: 4.33 MIL/uL (ref 3.87–5.11)
RDW: 12.9 % (ref 11.5–15.5)
WBC: 12.3 10*3/uL — ABNORMAL HIGH (ref 4.0–10.5)
nRBC: 0 % (ref 0.0–0.2)

## 2023-04-02 LAB — TYPE AND SCREEN
ABO/RH(D): A NEG
Antibody Screen: POSITIVE

## 2023-04-02 MED ORDER — OXYTOCIN-SODIUM CHLORIDE 30-0.9 UT/500ML-% IV SOLN: 2.5000 [IU]/h | INTRAVENOUS | Status: DC

## 2023-04-02 MED ORDER — SODIUM CHLORIDE 0.9 % IV SOLN: INTRAVENOUS | Status: DC | PRN

## 2023-04-02 MED ORDER — FUROSEMIDE 20 MG PO TABS
20.0000 mg | ORAL_TABLET | Freq: Every day | ORAL | Status: DC
  Administered 2023-04-03 – 2023-04-04 (×2): 20 mg via ORAL
  Filled 2023-04-02 (×2): qty 1

## 2023-04-02 MED ORDER — OXYTOCIN 10 UNIT/ML IJ SOLN
INTRAMUSCULAR | Status: AC
  Filled 2023-04-02: qty 1

## 2023-04-02 MED ORDER — MISOPROSTOL 50MCG HALF TABLET
50.0000 ug | ORAL_TABLET | Freq: Once | ORAL | Status: AC
  Administered 2023-04-02: 50 ug via BUCCAL
  Filled 2023-04-02: qty 1

## 2023-04-02 MED ORDER — OXYTOCIN BOLUS FROM INFUSION: 333.0000 mL | Freq: Once | INTRAVENOUS | Status: DC

## 2023-04-02 MED ORDER — ZOLPIDEM TARTRATE 5 MG PO TABS: 5.0000 mg | ORAL_TABLET | Freq: Every evening | ORAL | Status: DC | PRN

## 2023-04-02 MED ORDER — SODIUM CHLORIDE 0.9% FLUSH: 3.0000 mL | Freq: Two times a day (BID) | INTRAVENOUS | Status: DC

## 2023-04-02 MED ORDER — IBUPROFEN 600 MG PO TABS
600.0000 mg | ORAL_TABLET | Freq: Four times a day (QID) | ORAL | Status: DC
  Administered 2023-04-02 – 2023-04-04 (×8): 600 mg via ORAL
  Filled 2023-04-02 (×8): qty 1

## 2023-04-02 MED ORDER — ACETAMINOPHEN 325 MG PO TABS
650.0000 mg | ORAL_TABLET | ORAL | Status: DC | PRN
  Administered 2023-04-03 – 2023-04-04 (×3): 650 mg via ORAL
  Filled 2023-04-02 (×3): qty 2

## 2023-04-02 MED ORDER — LIDOCAINE HCL (PF) 1 % IJ SOLN: 30.0000 mL | INTRAMUSCULAR | Status: DC | PRN

## 2023-04-02 MED ORDER — ACETAMINOPHEN 500 MG PO TABS
1000.0000 mg | ORAL_TABLET | Freq: Four times a day (QID) | ORAL | Status: DC | PRN
  Administered 2023-04-02: 1000 mg via ORAL
  Filled 2023-04-02: qty 2

## 2023-04-02 MED ORDER — BENZOCAINE-MENTHOL 20-0.5 % EX AERO
1.0000 | INHALATION_SPRAY | CUTANEOUS | Status: DC | PRN
  Filled 2023-04-02: qty 56

## 2023-04-02 MED ORDER — ONDANSETRON HCL 4 MG/2ML IJ SOLN: 4.0000 mg | INTRAMUSCULAR | Status: DC | PRN

## 2023-04-02 MED ORDER — LACTATED RINGERS IV SOLN: INTRAVENOUS | Status: DC

## 2023-04-02 MED ORDER — SODIUM CHLORIDE 0.9% FLUSH: 3.0000 mL | INTRAVENOUS | Status: DC | PRN

## 2023-04-02 MED ORDER — COCONUT OIL OIL
1.0000 | TOPICAL_OIL | Status: DC | PRN
  Administered 2023-04-03: 1 via TOPICAL

## 2023-04-02 MED ORDER — TETANUS-DIPHTH-ACELL PERTUSSIS 5-2.5-18.5 LF-MCG/0.5 IM SUSY: 0.5000 mL | PREFILLED_SYRINGE | Freq: Once | INTRAMUSCULAR | Status: DC

## 2023-04-02 MED ORDER — FENTANYL CITRATE (PF) 100 MCG/2ML IJ SOLN: 50.0000 ug | INTRAMUSCULAR | Status: DC | PRN

## 2023-04-02 MED ORDER — OXYTOCIN 10 UNIT/ML IJ SOLN
10.0000 [IU] | Freq: Once | INTRAMUSCULAR | Status: DC
  Administered 2023-04-02: 10 [IU] via INTRAMUSCULAR

## 2023-04-02 MED ORDER — DIPHENHYDRAMINE HCL 25 MG PO CAPS: 25.0000 mg | ORAL_CAPSULE | Freq: Four times a day (QID) | ORAL | Status: DC | PRN

## 2023-04-02 MED ORDER — SENNOSIDES-DOCUSATE SODIUM 8.6-50 MG PO TABS
2.0000 | ORAL_TABLET | Freq: Every day | ORAL | Status: DC
  Administered 2023-04-03 – 2023-04-04 (×2): 2 via ORAL
  Filled 2023-04-02 (×2): qty 2

## 2023-04-02 MED ORDER — TERBUTALINE SULFATE 1 MG/ML IJ SOLN: 0.2500 mg | Freq: Once | INTRAMUSCULAR | Status: DC | PRN

## 2023-04-02 MED ORDER — WITCH HAZEL-GLYCERIN EX PADS: 1.0000 | MEDICATED_PAD | CUTANEOUS | Status: DC | PRN

## 2023-04-02 MED ORDER — OXYTOCIN 10 UNIT/ML IJ SOLN: 10.0000 [IU] | Freq: Once | INTRAMUSCULAR | Status: DC

## 2023-04-02 MED ORDER — ONDANSETRON HCL 4 MG PO TABS: 4.0000 mg | ORAL_TABLET | ORAL | Status: DC | PRN

## 2023-04-02 MED ORDER — SOD CITRATE-CITRIC ACID 500-334 MG/5ML PO SOLN: 30.0000 mL | ORAL | Status: DC | PRN

## 2023-04-02 MED ORDER — DIBUCAINE (PERIANAL) 1 % EX OINT: 1.0000 | TOPICAL_OINTMENT | CUTANEOUS | Status: DC | PRN

## 2023-04-02 MED ORDER — SIMETHICONE 80 MG PO CHEW: 80.0000 mg | CHEWABLE_TABLET | ORAL | Status: DC | PRN

## 2023-04-02 MED ORDER — PRENATAL MULTIVITAMIN CH
1.0000 | ORAL_TABLET | Freq: Every day | ORAL | Status: DC
  Filled 2023-04-02 (×2): qty 1

## 2023-04-02 MED ORDER — ONDANSETRON HCL 4 MG/2ML IJ SOLN: 4.0000 mg | Freq: Four times a day (QID) | INTRAMUSCULAR | Status: DC | PRN

## 2023-04-02 MED ORDER — LACTATED RINGERS IV SOLN: 500.0000 mL | INTRAVENOUS | Status: DC | PRN

## 2023-04-02 NOTE — Lactation Note (Signed)
This note was copied from a baby's chart. Lactation Consultation Note  Patient Name: Girl Brenley Priore [id number] Date: 04/02/2023 Age:32 years Reason for consult: Initial assessment;Term, See Birth Parent's MR: infant DAT+.  Per Birth Parent, infant recently BF for 20 minutes prior to [facility] entering the room and breastfeeding is going well no concerns for LC. Birth Parent is experienced with BF see maternal data below. Infant DAT+ see result review billi levels are low, Birth Parent only use DEBP if levels become elevated plans exclusively BF and she has 30 mls of EBM at home if she need to supplement infant with focus on exclusively breastfeeding. Birth Parent will continue to breastfeed infant by cues, on demand every 2-3 hours, skin to skin. LC discussed the importance of rest, diet and hydration. Birth Parent was  made aware of O/P services, breastfeeding support groups, community resources, and our phone # for post-discharge questions.    Maternal Data Has patient been taught Hand Expression?: Yes Does the patient have breastfeeding experience prior to this delivery?: Yes How long did the patient breastfeed?: Birth Parent BF, 1st child for 2 years and she is currently 2 yrs6 months.  Feeding Mother's Current Feeding Choice: Breast Milk  LATCH Score  LC did not observe latch.                   Lactation Tools Discussed/Used    Interventions Interventions: Breast feeding basics reviewed;Skin to skin;Education;LC Services brochure  Discharge Pump: DEBP;Personal  Consult Status Consult Status: Follow-up Date: 04/03/23 Follow-up type: In-patient    Frederico Hamman 04/02/2023, 11:01 PM

## 2023-04-02 NOTE — H&P (Signed)
OB ADMISSION/ HISTORY & PHYSICAL:  Admission Date: 04/02/2023  4:08 PM  Admit Diagnosis: Encounter for induction of labor [Z34.90]    Kayla Everett is a 32 y.o. female G2P1001 at [redacted]w[redacted]d presenting for induction of labor for oligohydramnios. Seen at the office today for routine visit with BPP/NST. AFI 3.0cm. Sent for direct admission. Reports some contractions over the last few days. Denies leaking of fluid or vaginal bleeding. Endorses + fetal movement. Husband, Kayla Everett, present and supportive. Doula, Kayla Everett, will join in active labor. Eagerly anticipating baby girl "Kayla Everett".   Prenatal History: G2P1001   EDC: 03/31/2023 Prenatal care at [facility] since 11 weeks  Primary: D. Renae Fickle, CNM  Prenatal course complicated by: Large ovarian cyst at C/P visit, 10cm, undetectable at CUS, plan to F/U postpartum History of ASD repair, had echo at 34 weeks, found mitral and tricuspid valve regurgitation, OB cardiology recommends 5 days of diuretics postpartum and frequent BP monitoring, plan to F/U with cards PP Rh negative, s/p Rhogam at 28 weeks  Prenatal Labs: ABO, Rh:   A NEG Antibody:  Negative Rubella:   Immune RPR:   Non-reactive HBsAg:   Negative HIV:   Negative GBS:   Negative 1 hr Glucola : 140, passed 3 hr gtt Genetic Screening: low risk Unity XX, Rh positive fetus Ultrasound: normal XX anatomy, anterior placenta, AFI 3.0cm    Maternal Diabetes: No Genetic Screening: Normal Maternal Ultrasounds/Referrals: Normal Fetal Ultrasounds or other Referrals:  None Maternal Substance Abuse:  No Significant Maternal Medications:  None Significant Maternal Lab Results:  Group B Strep negative and Rh negative Other Comments:  None  Medical / Surgical History : Past medical history:  Past Medical History:  Diagnosis Date   Anxiety    Depression    Heart murmur    Palpitations    Residual ASD (atrial septal defect) following repair june 2012   [facility]    Past surgical  history:  Past Surgical History:  Procedure Laterality Date   ASD REPAIR      Family History:  Family History  Problem Relation Age of Onset   Hyperlipidemia Father    Heart disease Father    Hyperlipidemia Paternal Grandfather    Heart disease Paternal Grandfather     Social History:  reports that she has never smoked. She has never used smokeless tobacco. She reports that she does not drink alcohol and does not use drugs.  Allergies: Patient has no known allergies.   Current Medications at time of admission:  Medications Prior to Admission  Medication Sig Dispense Refill Last Dose   magnesium oxide (MAG-OX) 400 MG tablet Take 400 mg by mouth daily.      Prenatal Vit-Fe Fumarate-FA (PRENATAL MULTIVITAMIN) TABS tablet Take 1 tablet by mouth daily at 12 noon.       Review of Systems: Review of Systems  Psychiatric/Behavioral:  The patient is nervous/anxious.   All other systems reviewed and are negative.  Physical Exam: Vital signs and nursing notes reviewed.  Patient Vitals for the past 24 hrs:  BP Pulse  04/02/23 1647 (!) 143/87 71    General: AAO x 3, NAD Heart: RRR Lungs:CTAB Abdomen: Gravid, NT Extremities: no edema SVE: Dilation: 2 Effacement (%): 50 Station: -2 Presentation: Vertex Exam by:: a Aiven Kampe cnm   FHR: 150BPM, moderate variability, + accels, no decels TOCO: Contractions occasional  Labs:   No results for input(s): "WBC", "HGB", "HCT", "PLT" in the last 72 hours.  Assessment/Plan: 32 y.o. G2P1001  at [redacted]w[redacted]d, oligohydramnios, AFI 3.0cm Rh negative, baby Rh positive per Unity screen, s/p Rhogam at 28 weeks History of ASD repair, mitral and tricuspid valve regurgitation, plan 5 days of diuretics postpartum per cards  Fetal wellbeing - FHT category 1 EFW SGA 5-6lbs  Labor: Foley balloon placed, plan buccal Cytotec q 4 hours, AROM after Foley balloon is expelled  GBS negative Rubella immune Rh negative, s/p Rhogam at 28 weeks  Pain  control: desires unmedicated birth, risked out of waterbirth for oligohydramnios Analgesia/anesthesia PRN  Anticipated MOD: NSVB  Plans to breastfeed. POC discussed with patient and support team, all questions answered.  Dr. Billy Coast notified of admission/plan of care.  June Leap CNM, MSN 04/02/2023, 5:05 PM

## 2023-04-03 LAB — CBC
HCT: 36.1 % (ref 36.0–46.0)
Hemoglobin: 12.9 g/dL (ref 12.0–15.0)
MCH: 33.2 pg (ref 26.0–34.0)
MCHC: 35.7 g/dL (ref 30.0–36.0)
MCV: 92.8 fL (ref 80.0–100.0)
Platelets: 135 10*3/uL — ABNORMAL LOW (ref 150–400)
RBC: 3.89 MIL/uL (ref 3.87–5.11)
RDW: 13 % (ref 11.5–15.5)
WBC: 16 10*3/uL — ABNORMAL HIGH (ref 4.0–10.5)
nRBC: 0 % (ref 0.0–0.2)

## 2023-04-03 LAB — RPR: RPR Ser Ql: NONREACTIVE

## 2023-04-03 MED ORDER — BENZOCAINE-MENTHOL 20-0.5 % EX AERO: 1.0000 | INHALATION_SPRAY | CUTANEOUS | Status: DC | PRN

## 2023-04-03 MED ORDER — IBUPROFEN 600 MG PO TABS: 600.0000 mg | ORAL_TABLET | Freq: Four times a day (QID) | ORAL | 0 refills | Status: AC

## 2023-04-03 MED ORDER — ACETAMINOPHEN 325 MG PO TABS: 650.0000 mg | ORAL_TABLET | ORAL | Status: AC | PRN

## 2023-04-03 MED ORDER — RHO D IMMUNE GLOBULIN 1500 UNIT/2ML IJ SOSY
300.0000 ug | PREFILLED_SYRINGE | Freq: Once | INTRAMUSCULAR | Status: AC
  Administered 2023-04-03: 300 ug via INTRAVENOUS
  Filled 2023-04-03: qty 2

## 2023-04-03 MED ORDER — COCONUT OIL OIL: 1.0000 | TOPICAL_OIL | Status: DC | PRN

## 2023-04-03 MED ORDER — FUROSEMIDE 20 MG PO TABS: 20.0000 mg | ORAL_TABLET | Freq: Every day | ORAL | 0 refills | Status: DC

## 2023-04-03 NOTE — Discharge Instructions (Signed)
Lactation outpatient support - home visit   Jessica Bowers, IBCLC (lactation consultant)  & Birth Doula  Phone (text or call): [phone] Email: [email or web address]   Linda Coppola RN, MHA, IBCLC at [facility]: Lactation Consultant  [email or web address] Mail: [email or web address] Tel: [phone]   Additional breastfeeding resources:  [facility] https://ibconline.ca/information-sheets/  [employer] [place]  [email or web address].[email or web address]   Other Resources:  Chiropractic specialist   Dr. Leanna Hastings [email or web address]   Craniosacral therapy for baby  Erin Balkind  [email or web address]  Pediatric Dentist (for tongue ties)  Dr. Kate Lambert Spangler, [facility]  Phone: [phone]  [address] [place]  [address] [email or web address]  

## 2023-04-03 NOTE — Social Work (Signed)
[name] was referred for history of depression/anxiety.  * Referral screened out by Clinical Social Worker because none of the following criteria appear to apply:  ~ History of anxiety/depression during this pregnancy, or of post-partum depression following prior delivery.  ~ Diagnosis of anxiety and/or depression within last 3 years OR * [name]'s symptoms currently being treated with medication and/or therapy.  Per chart review "She reports good control with therapy" and propranolol used as needed, minimally. [name] was diagnosed prior to October 2021. No concerns noted during this pregnancy.  Please contact the Clinical Social Worker if needs arise, by [facility] request, or if [name] scores greater than 9/yes to question 10 on Edinburgh Postpartum Depression Screen.  Kayla Everett, LCSWA Clinical Social Worker [phone]

## 2023-04-03 NOTE — Discharge Summary (Signed)
OB Discharge Summary  Patient Name: Kayla Everett DOB: Jan 06, 1991 MRN: [id number]  Date of admission: 04/02/2023 Delivering provider: Dorisann Frames K  Admitting diagnosis: Encounter for induction of labor [Z34.90] Intrauterine pregnancy: [redacted]w[redacted]d     Secondary diagnosis: Patient Active Problem List   Diagnosis Date Noted   Encounter for induction of labor 04/02/2023   Postpartum care following vaginal delivery 9/30 04/02/2023   Rh negative status during pregnancy 04/02/2023   Maternal mitral valve regurgitation affecting pregnancy in third trimester 04/02/2023   SVD (spontaneous vaginal delivery) 09/30/2020   Additional problems:none   Date of discharge: 04/03/2023   Discharge diagnosis: Principal Problem:   Postpartum care following vaginal delivery 9/30 Active Problems:   SVD (spontaneous vaginal delivery)   Encounter for induction of labor   Rh negative status during pregnancy   Maternal mitral valve regurgitation affecting pregnancy in third trimester                                                              Post partum procedures:rhogam  Augmentation: AROM, Cytotec, and OP Foley Pain control: None Laceration:None Episiotomy:None Complications: None  Hospital course:  Induction of Labor With Vaginal Delivery   32 y.o. yo [id number] at [redacted]w[redacted]d was admitted to the hospital 04/02/2023 for induction of labor.  Indication for induction:  oligo .  Patient had an labor course complicated by none Membrane Rupture Time/Date: 6:40 PM,04/02/2023  Delivery Method:Vaginal, Spontaneous Operative Delivery:N/A Episiotomy: None Lacerations:  None Details of delivery can be found in separate delivery note.  Patient had a postpartum course complicated by none. Patient is discharged home 04/03/23. Per cardio recommendation will give 5 days Lasix 20 mg due to history of mitral valve regurgitation. Close follow up in office in 1 week.   Newborn Data: Birth date:04/02/2023 Birth  time:7:19 PM Gender:Female Living status:Living Apgars:7 ,9  Weight:3100 g  Physical exam  Vitals:   04/02/23 2349 04/03/23 0202 04/03/23 0627 04/03/23 0925  BP:  (!) 113/56 (!) 117/59 (!) 110/58  Pulse:  66 60   Resp:  17 16   Temp: 98.7 F (37.1 C) 98.1 F (36.7 C) 98.2 F (36.8 C)   TempSrc: Oral Oral Oral   SpO2:  98% 98%   Weight:      Height:       General: alert, cooperative, and no distress Lochia: appropriate Uterine Fundus: firm Incision: N/A Perineum: intact, no edema DVT Evaluation: No cords or calf tenderness. No significant calf/ankle edema. Labs: Lab Results  Component Value Date   WBC 16.0 (H) 04/03/2023   HGB 12.9 04/03/2023   HCT 36.1 04/03/2023   MCV 92.8 04/03/2023   PLT 135 (L) 04/03/2023       No data to display            10/01/2020    9:05 AM 09/30/2020    6:03 PM 09/30/2020    6:20 AM  Edinburgh Postnatal Depression Scale Screening Tool  I have been able to laugh and see the funny side of things. 0 -- --  I have looked forward with enjoyment to things. 0    I have blamed myself unnecessarily when things went wrong. 0    I have been anxious or worried for no good reason. 0  I have felt scared or panicky for no good reason. 0    Things have been getting on top of me. 0    I have been so unhappy that I have had difficulty sleeping. 0    I have felt sad or miserable. 0    I have been so unhappy that I have been crying. 0    The thought of harming myself has occurred to me. 0    Edinburgh Postnatal Depression Scale Total 0      Discharge instruction:  per After Visit Summary,  [facility] booklet and  "Understanding Mother & Baby Care" hospital booklet After Visit Meds:  Allergies as of 04/03/2023   No Known Allergies      Medication List     TAKE these medications    acetaminophen 325 MG tablet Commonly known as: Tylenol Take 2 tablets (650 mg total) by mouth every 4 (four) hours as needed (for pain scale < 4).    benzocaine-Menthol 20-0.5 % Aero Commonly known as: DERMOPLAST Apply 1 Application topically as needed for irritation (perineal discomfort).   coconut oil Oil Apply 1 Application topically as needed.   furosemide 20 MG tablet Commonly known as: LASIX Take 1 tablet (20 mg total) by mouth daily for 4 days. Start taking on: April 04, 2023   ibuprofen 600 MG tablet Commonly known as: ADVIL Take 1 tablet (600 mg total) by mouth every 6 (six) hours.   magnesium oxide 400 MG tablet Commonly known as: MAG-OX Take 400 mg by mouth daily.   prenatal multivitamin Tabs tablet Take 1 tablet by mouth daily at 12 noon.               Discharge Care Instructions  (From admission, onward)           Start     Ordered   04/03/23 0000  Discharge wound care:       Comments: Sitz baths 2 times /day with warm water x 1 week. May add herbals: 1 ounce dried comfrey leaf* 1 ounce calendula flowers 1 ounce lavender flowers  Supplies can be found online at [employer] sources at [employer], [employer]  1/2 ounce dried uva ursi leaves 1/2 ounce witch hazel blossoms (if you can find them) 1/2 ounce dried sage leaf 1/2 cup sea salt Directions: Bring 2 quarts of water to a boil. Turn off heat, and place 1 ounce (approximately 1 large handful) of the above mixed herbs (not the salt) into the pot. Steep, covered, for 30 minutes.  Strain the liquid well with a fine mesh strainer, and discard the herb material. Add 2 quarts of liquid to the tub, along with the 1/2 cup of salt. This medicinal liquid can also be made into compresses and peri-rinses.   04/03/23 1056           Diet: routine diet Activity: Advance as tolerated. Pelvic rest for 6 weeks.  Postpartum contraception: TBA in office Newborn Data: Live born female  Birth Weight: 6 lb 13.4 oz (3100 g) APGAR: 7, 9  Newborn Delivery   Birth date/time: 04/02/2023 19:19:00 Delivery type: Vaginal, Spontaneous      named [name] Baby Feeding: Breast Disposition:home with mother Delivery Report: Review the Delivery Report for details.   Follow up:  Follow-up Information     Neta Mends, CNM. Schedule an appointment as soon as possible for a visit in 1 week(s).   Specialty: Obstetrics and Gynecology Why: For Postpartum follow-up,  Blood pressure check Contact information: [address] [place] [address] [phone]                Signed: Neta Mends, CNM, MSN 04/03/2023, 10:57 AM

## 2023-04-03 NOTE — Lactation Note (Signed)
This note was copied from a baby's chart. Lactation Consultation Note  Patient Name: Kayla Everett ZOXWR'U Date: 04/03/2023 Age:31 hours  Reason for consult: Follow-up assessment;Mother's request  Return to room to assess infant latch. Baby latches briefly with a few suckles then pulls away. Baby is fussy and crying. Efforts made to burp and console baby. Mother feeling concerned that baby will not sustain latch and how fussy she is when attempting to latch.   Discussed pumping and hand expressing for collecting colostrum to supplement baby. Mother prefers to do and expression and she was receptive to using a hand pump. Instructed mother on the use, frequency, cleaning of breast pump and storage of breast milk.   Mother will supplement with her colostrum when baby is not latching. Mother has an OP Advertising copywriter that she will be seeing after discharge.   Pediatricians and mother's nurse were updated about mother's concerns and infant's feeding behaviors.   Encouraged to latch baby with feeding cues, place baby skin to skin if not latching.  Call for assistance with breastfeeding, as needed.  Anticipate as baby approaches 24 hours of age, baby will feed more often at breast, 8-12 plus times in 24 hours and "cluster feed"        Feeding Mother's Current Feeding Choice: Breast Milk  LATCH Score Latch: Repeated attempts needed to sustain latch, nipple held in mouth throughout feeding, stimulation needed to elicit sucking reflex.  Audible Swallowing: None  Type of Nipple: Everted at rest and after stimulation  Comfort (Breast/Nipple): Soft / non-tender  Hold (Positioning): Assistance needed to correctly position infant at breast and maintain latch. (mother can latch independently, LC offered assist)  LATCH Score: 6   Lactation Tools Discussed/Used Tools: Pump;Flanges Flange Size: 21 Breast pump type: Manual Pump Education: Setup, frequency, and cleaning;Milk  Storage Reason for Pumping: stimulate milk production, provide supplement Pumping frequency: pump when baby is not latching  Interventions Interventions: Assisted with latch;Support pillows;Adjust position;Hand pump;Education  Discharge    Consult Status Consult Status: Follow-up Date: 04/04/23 Follow-up type: In-patient    Christella Hartigan M 04/03/2023, 2:59 PM

## 2023-04-03 NOTE — Lactation Note (Signed)
This note was copied from a baby's chart. Lactation Consultation Note  Patient Name: Kayla Everett ZOXWR'U Date: 04/03/2023 Age:31 hours  Reason for consult: Follow-up assessment;Term;Breastfeeding assistance;Mother's request  P2, [redacted]w[redacted]d, 2% weight loss  Mother requesting to see LC regarding infant breastfeeding. Mother reports that baby breast fed frequently during the night with several good feedings. Baby was fussy and mother requested LC assist this am. Once we arrived, baby was calm and not interested in feeding. Mother attempted and her technique with latch was very good. Reviewed basic breastfeeding education with newborns.   Mother to call if she needs help with breastfeeding.     Feeding Mother's Current Feeding Choice: Breast Milk  LATCH Score Latch: Too sleepy or reluctant, no latch achieved, no sucking elicited.     Interventions Interventions: Breast feeding basics reviewed;Assisted with latch;Skin to skin;Support pillows;Adjust position;Education  Discharge    Consult Status Consult Status: Follow-up Date: 04/04/23 Follow-up type: In-patient    Christella Hartigan M 04/03/2023, 11:24 AM

## 2023-04-04 LAB — RH IG WORKUP (INCLUDES ABO/RH)
Fetal Screen: NEGATIVE
Gestational Age(Wks): 40.2
Unit division: 0

## 2023-04-04 NOTE — Lactation Note (Signed)
This note was copied from a baby's chart. Lactation Consultation Note  Patient Name: Kayla Everett [id number] Date: 04/04/2023 Age:31 hours  Reason for consult: Follow-up assessment;Term  P2, [redacted]w[redacted]d  Mother had requested that I return at 6 pm to see how feedings were progressing. Mother says baby had a 15 min feeding earlier and she has been feeding ad lib.  At this feeding, baby suckles briefly and falls back to sleep. Baby has not voided or stooled today. Advised mother to give her colostrum, at least 9 ml at this feeding and repeat every 3 hours due to baby's feeding effort at breast. Baby will benefit from increased calories. Mother agreeable. She is reaching out to her husband to bring the remaining colostrum for infant feeding. Mother prefers to administer her milk to baby via her dropper that milk is stored in. Mother is aware to use her thawed EBM within 24 hours.   Instructed to call for assistance as needed.    Feeding Mother's Current Feeding Choice: Breast Milk  LATCH Score Latch: Repeated attempts needed to sustain latch, nipple held in mouth throughout feeding, stimulation needed to elicit sucking reflex.  Audible Swallowing: A few with stimulation  Type of Nipple: Everted at rest and after stimulation  Comfort (Breast/Nipple): Soft / non-tender  Hold (Positioning): No assistance needed to correctly position infant at breast.  LATCH Score: 8    Interventions Interventions: Education    Consult Status Consult Status: Follow-up Date: 04/05/23 Follow-up type: In-patient    Christella Hartigan M 04/04/2023, 6:30 PM

## 2023-04-04 NOTE — Lactation Note (Signed)
This note was copied from a baby's chart. Lactation Consultation Note  Patient Name: Kayla Everett [id number] Date: 04/04/2023 Age:31 hours  Reason for consult: Follow-up assessment;Term;Breastfeeding assistance  P2, [redacted]w[redacted]d, 5% weight loss  Observed baby breastfeeding. Infant latched well and intermittent bursts of swallows noted especially with breast compression. Mother has 30 ml of colostrum that she collected prior to delivery at home that she plans to feed baby. Mother also has a OP Lactation Consultant coming to her home the day of discharge.  Mother to continue to feed baby with cues. This is an experienced breastfeeding mother. Mom made aware of O/P services, breastfeeding support groups, community resources, and our phone # for post-discharge questions.     Feeding Mother's Current Feeding Choice: Breast Milk  LATCH Score Latch: Grasps breast easily, tongue down, lips flanged, rhythmical sucking.  Audible Swallowing: A few with stimulation  Type of Nipple: Everted at rest and after stimulation  Comfort (Breast/Nipple): Soft / non-tender  Hold (Positioning): Assistance needed to correctly position infant at breast and maintain latch.  LATCH Score: 8    Interventions Interventions: Breast feeding basics reviewed;Assisted with latch;Education  Discharge Discharge Education: Engorgement and breast care;Warning signs for feeding baby  Consult Status Consult Status: Complete Date: 04/04/23    Omar Person 04/04/2023, 10:18 AM

## 2023-04-04 NOTE — Discharge Summary (Signed)
OB Discharge Summary  Patient Name: Kayla Everett DOB: Nov 27, 1990 MRN: [id number]  Date of admission: 04/02/2023 Delivering provider: Dorisann Frames K  Admitting diagnosis: Encounter for induction of labor [Z34.90] Intrauterine pregnancy: [redacted]w[redacted]d     Secondary diagnosis: Patient Active Problem List   Diagnosis Date Noted   Encounter for induction of labor 04/02/2023   Postpartum care following vaginal delivery 9/30 04/02/2023   Rh negative status during pregnancy 04/02/2023   Maternal mitral valve regurgitation affecting pregnancy in third trimester 04/02/2023   SVD (spontaneous vaginal delivery) 09/30/2020    Date of discharge: 04/04/2023   Discharge diagnosis: Principal Problem:   Postpartum care following vaginal delivery 9/30 Active Problems:   SVD (spontaneous vaginal delivery)   Encounter for induction of labor   Rh negative status during pregnancy   Maternal mitral valve regurgitation affecting pregnancy in third trimester                                                           Augmentation: AROM, Cytotec, and IP Foley Pain control: None Laceration:None Complications: None  Hospital course:  Induction of Labor With Vaginal Delivery   32 y.o. yo [id number] at [redacted]w[redacted]d was admitted to the hospital 04/02/2023 for induction of labor.  Indication for induction:  oligohydramnios .  Patient had an labor course complicated by precipitous labor. Membrane Rupture Time/Date: 6:40 PM,04/02/2023  Delivery Method:Vaginal, Spontaneous Operative Delivery:N/A Episiotomy: None Lacerations:  None Details of delivery can be found in separate delivery note. Patient had a postpartum course complicated by history of mitral and tricuspid valve regurgitation. She will receive 5 days of Lasix and will F/U in the office in 1 week for a blood pressure check. Patient is discharged home 04/04/23.  Newborn Data: Birth date:04/02/2023 Birth time:7:19 PM Gender:Female Living status:Living Apgars:7 ,9   Weight:3100 g  Physical exam  Vitals:   04/03/23 0925 04/03/23 1430 04/03/23 2100 04/04/23 0630  BP: (!) 110/58  117/73 115/76  Pulse: 68 65 70 63  Resp: 16 16 20 20   Temp: 98.2 F (36.8 C) 98.5 F (36.9 C) 97.7 F (36.5 C) 98 F (36.7 C)  TempSrc: Oral Oral Oral Oral  SpO2: 98%  95% 96%  Weight:      Height:       General: alert and cooperative Lochia: appropriate Uterine Fundus: firm Perineum: intact DVT Evaluation: No evidence of DVT seen on physical exam.  Labs: Lab Results  Component Value Date   WBC 16.0 (H) 04/03/2023   HGB 12.9 04/03/2023   HCT 36.1 04/03/2023   MCV 92.8 04/03/2023   PLT 135 (L) 04/03/2023      04/04/2023    6:00 AM 10/01/2020    9:05 AM 09/30/2020    6:03 PM 09/30/2020    6:20 AM  Edinburgh Postnatal Depression Scale Screening Tool  I have been able to laugh and see the funny side of things. 0 0 -- --  I have looked forward with enjoyment to things. 0 0    I have blamed myself unnecessarily when things went wrong. 1 0    I have been anxious or worried for no good reason. 2 0    I have felt scared or panicky for no good reason. 1 0    Things have been getting on top of  me. 1 0    I have been so unhappy that I have had difficulty sleeping. 0 0    I have felt sad or miserable. 0 0    I have been so unhappy that I have been crying. 0 0    The thought of harming myself has occurred to me. 0 0    Edinburgh Postnatal Depression Scale Total 5 0     Discharge instructions:  per After Visit Summary  After Visit Meds:  Allergies as of 04/04/2023   No Known Allergies      Medication List     TAKE these medications    acetaminophen 325 MG tablet Commonly known as: Tylenol Take 2 tablets (650 mg total) by mouth every 4 (four) hours as needed (for pain scale < 4).   benzocaine-Menthol 20-0.5 % Aero Commonly known as: DERMOPLAST Apply 1 Application topically as needed for irritation (perineal discomfort).   coconut oil Oil Apply 1  Application topically as needed.   furosemide 20 MG tablet Commonly known as: LASIX Take 1 tablet (20 mg total) by mouth daily for 4 days.   ibuprofen 600 MG tablet Commonly known as: ADVIL Take 1 tablet (600 mg total) by mouth every 6 (six) hours.   magnesium oxide 400 MG tablet Commonly known as: MAG-OX Take 400 mg by mouth daily.   prenatal multivitamin Tabs tablet Take 1 tablet by mouth daily at 12 noon.               Discharge Care Instructions  (From admission, onward)           Start     Ordered   04/03/23 0000  Discharge wound care:       Comments: Sitz baths 2 times /day with warm water x 1 week. May add herbals: 1 ounce dried comfrey leaf* 1 ounce calendula flowers 1 ounce lavender flowers  Supplies can be found online at [employer] sources at [employer], [employer]  1/2 ounce dried uva ursi leaves 1/2 ounce witch hazel blossoms (if you can find them) 1/2 ounce dried sage leaf 1/2 cup sea salt Directions: Bring 2 quarts of water to a boil. Turn off heat, and place 1 ounce (approximately 1 large handful) of the above mixed herbs (not the salt) into the pot. Steep, covered, for 30 minutes.  Strain the liquid well with a fine mesh strainer, and discard the herb material. Add 2 quarts of liquid to the tub, along with the 1/2 cup of salt. This medicinal liquid can also be made into compresses and peri-rinses.   04/03/23 1056           Activity: Advance as tolerated. Pelvic rest for 6 weeks.   Newborn Data: Live born female  Birth Weight: 6 lb 13.4 oz (3100 g) APGAR: 7, 9  Newborn Delivery   Birth date/time: 04/02/2023 19:19:00 Delivery type: Vaginal, Spontaneous    Named [name] Baby Feeding: Breast Disposition:home with mother  Delivery Report:  Review the Delivery Report for details.    Follow up:  Follow-up Information     June Leap, CNM. Schedule an appointment as soon as possible for a visit in 1 week(s).    Specialty: Certified Nurse Midwife Why: For postpartum blood pressure check. Contact information: [address] [place] [address] [phone]                Clancy Gourd, MSN 04/04/2023, 9:47 AM

## 2023-04-05 ENCOUNTER — Ambulatory Visit (HOSPITAL_COMMUNITY): Payer: Self-pay

## 2023-04-05 NOTE — Lactation Note (Signed)
This note was copied from a baby's chart. Lactation Consultation Note  Patient Name: Kayla Everett ZOXWR'[id number] Date: 04/05/2023 Age:31 hours Reason for consult: Initial assessment;Term  P2, Experienced with breastfeeding.  Baby has had numerous voids since birth but only one stool.  It was suggested to mother to give extra volume back to baby via hand expression yesterday. Per mother, baby falls asleep after one breast and has to be woken up to continue feeding on second breast after a rest between.   Suggest post pumping a few times per day and give volume back to baby in addition to breastfeeding until baby is having regular stools.  Mother has 30 ml of collect colostrum at home and feels her breast are filling. Suggest waking baby for feeds by undressing and feeding skin to skin.   Reviewed engorgement care and monitoring voids/stools.    Maternal Data Has patient been taught Hand Expression?: Yes  Feeding Mother's Current Feeding Choice: Breast Milk  LATCH Score Latch: Grasps breast easily, tongue down, lips flanged, rhythmical sucking.  Audible Swallowing: Spontaneous and intermittent  Type of Nipple: Everted at rest and after stimulation  Comfort (Breast/Nipple): Soft / non-tender  Hold (Positioning): No assistance needed to correctly position infant at breast.  LATCH Score: 10   Lactation Tools Discussed/Used  DEBP  Interventions  Education   Discharge Discharge Education: Engorgement and breast care;Warning signs for feeding baby Pump: Personal;DEBP  Consult Status Consult Status: Complete Date: 04/05/23    Dahlia Byes Boschen  RN IBCLC 04/05/2023, 11:20 AM

## 2023-04-24 ENCOUNTER — Ambulatory Visit: Payer: Commercial Managed Care - PPO | Attending: Cardiology | Admitting: Cardiology

## 2023-04-24 ENCOUNTER — Encounter: Payer: Self-pay | Admitting: Cardiology

## 2023-04-24 VITALS — BP 94/68 | HR 59 | Ht 62.0 in | Wt 123.6 lb

## 2023-04-24 DIAGNOSIS — I361 Nonrheumatic tricuspid (valve) insufficiency: Secondary | ICD-10-CM

## 2023-04-24 DIAGNOSIS — I340 Nonrheumatic mitral (valve) insufficiency: Secondary | ICD-10-CM

## 2023-04-24 DIAGNOSIS — Z8774 Personal history of (corrected) congenital malformations of heart and circulatory system: Secondary | ICD-10-CM

## 2023-04-24 NOTE — Patient Instructions (Signed)
Medication Instructions:  NO CHANGES    Lab Work: NONE    Testing/Procedures: NONE   Follow-Up: At [facility], you and your health needs are our priority.  As part of our continuing mission to provide you with exceptional heart care, we have created designated Provider Care Teams.  These Care Teams include your primary Cardiologist (physician) and Advanced Practice Providers (APPs -  Physician Assistants and Nurse Practitioners) who all work together to provide you with the care you need, when you need it.    Your next appointment:   9 MONTHS   Provider:   DR. Thomasene Ripple

## 2023-04-24 NOTE — Progress Notes (Unsigned)
Cardio-Obstetrics Clinic  New Evaluation   Date:  04/25/2023   ID:  Mauricio Po, DOB 1991/03/27, MRN [id number]  PCP:  Macy Mis, MD   [facility] Providers Cardiologist:  Thomasene Ripple, DO  Electrophysiologist:  None        Referring MD: Macy Mis, MD   Chief Complaint: " I am ok"   History of Present Illness:    Kayla Everett is a 32 y.o. female [G2P2002] who returns for follow up of ASD s/p ASD closure in 2012/psychologist note that this is an exertion and was worked up and was found to have ASD, mild to moderate TR and mild to to moderate mitral regurgitation.   I saw her during pregnancy at that time we talked about her echocardiogram and plans for follow-up.  Since I saw the patient she has delivered successfully.  She is a  new mother, presents for a postpartum follow-up. She reports that breastfeeding is going well and she feels supported. She admits to feeling overwhelmed at times but denies any distress or crying. She reports occasional dizziness and fatigue, which she attributes to tiredness and possibly inadequate fluid intake. She is drinking a lot of fluids due to increased thirst while breastfeeding.  Previously, the patient was evaluated for a mitral valve issue during her pregnancy. She was symptomatic before, but currently, she is not reporting any significant symptoms. She is three weeks postpartum. She is considering a follow-up echocardiogram to assess the status of the mitral valve issue, but is unsure about the timing.   Prior CV Studies Reviewed: The following studies were reviewed today: Echo reviewed   Past Medical History:  Diagnosis Date   Anxiety    Depression    Heart murmur    Palpitations    Residual ASD (atrial septal defect) following repair june 2012   [facility]    Past Surgical History:  Procedure Laterality Date   ASD REPAIR        OB History     Gravida  2   Para  2   Term  2   Preterm       AB      Living  2      SAB      IAB      Ectopic      Multiple  0   Live Births  2               Current Medications: Current Meds  Medication Sig   acetaminophen (TYLENOL) 325 MG tablet Take 2 tablets (650 mg total) by mouth every 4 (four) hours as needed (for pain scale < 4).   benzocaine-Menthol (DERMOPLAST) 20-0.5 % AERO Apply 1 Application topically as needed for irritation (perineal discomfort).   coconut oil OIL Apply 1 Application topically as needed.   ibuprofen (ADVIL) 600 MG tablet Take 1 tablet (600 mg total) by mouth every 6 (six) hours.   magnesium oxide (MAG-OX) 400 MG tablet Take 400 mg by mouth daily.   Prenatal Vit-Fe Fumarate-FA (PRENATAL MULTIVITAMIN) TABS tablet Take 1 tablet by mouth daily at 12 noon.     Allergies:   Patient has no known allergies.   Social History   Socioeconomic History   Marital status: Married    Spouse name: Not on file   Number of children: Not on file   Years of education: Not on file   Highest education level: Not on file  Occupational History  Not on file  Tobacco Use   Smoking status: Never   Smokeless tobacco: Never  Vaping Use   Vaping status: Former  Substance and Sexual Activity   Alcohol use: No    Alcohol/week: 0.0 standard drinks of alcohol   Drug use: No   Sexual activity: Not on file  Other Topics Concern   Not on file  Social History Narrative   Not on file   Social Determinants of Health   Financial Resource Strain: Not on file  Food Insecurity: Unknown (04/02/2023)   Hunger Vital Sign    Worried About Running Out of Food in the Last Year: Never true    Ran Out of Food in the Last Year: Not on file  Transportation Needs: No Transportation Needs (04/02/2023)   PRAPARE - Administrator, Civil Service (Medical): No    Lack of Transportation (Non-Medical): No  Physical Activity: Not on file  Stress: Not on file  Social Connections: Unknown (11/02/2021)   Received from [facility]   Social Network    Social Network: Not on file      Family History  Problem Relation Age of Onset   Hyperlipidemia Father    Heart disease Father    Hyperlipidemia Paternal Grandfather    Heart disease Paternal Grandfather       ROS:   Please see the history of present illness.     All other systems reviewed and are negative.   Labs/EKG Reviewed:    EKG:   EKG was ordered today.  The ekg ordered today demonstrates sinus rhythm, heart rate 60 bpm  Recent Labs: 04/03/2023: Hemoglobin 12.9; Platelets 135   Recent Lipid Panel No results found for: "CHOL", "TRIG", "HDL", "CHOLHDL", "LDLCALC", "LDLDIRECT"  Physical Exam:    VS:  BP 94/68 (BP Location: Right Arm, Patient Position: Sitting, Cuff Size: Normal)   Pulse (!) 59   Ht 5\' 2"  (1.575 m)   Wt 123 lb 9.6 oz (56.1 kg)   LMP  (LMP Unknown)   SpO2 98%   BMI 22.61 kg/m     Wt Readings from Last 3 Encounters:  04/24/23 123 lb 9.6 oz (56.1 kg)  04/02/23 137 lb (62.1 kg)  03/02/23 140 lb 9.6 oz (63.8 kg)     GEN:  Well nourished, well developed in no acute distress HEENT: Normal NECK: No JVD; No carotid bruits LYMPHATICS: No lymphadenopathy CARDIAC: RRR, no murmurs, rubs, gallops RESPIRATORY:  Clear to auscultation without rales, wheezing or rhonchi  ABDOMEN: Soft, non-tender, non-distended MUSCULOSKELETAL:  No edema; No deformity  SKIN: Warm and dry NEUROLOGIC:  Alert and oriented x 3 PSYCHIATRIC:  Normal affect    Risk Assessment/Risk Calculators:                  ASSESSMENT & PLAN:    Status post ASD closure Mild to moderate tricuspid regurgitation Mild to moderate mitral regurgitation   Postpartum Cardiac Monitoring Patient is 3 weeks postpartum with a history of mitral valve regurgitation. No current symptoms of heart failure or valvular dysfunction. Discussed the importance of monitoring for symptoms such as shortness of breath, especially when lying down at night, and  the need to prop up with pillows for comfort. -Schedule follow-up visit in 9 months to assess the regurgitant lesion and closure. -Encouraged patient to contact the office via MyChart if any symptoms arise.  Breastfeeding Patient reports breastfeeding is going well and she feels supported. Advised on the importance of adequate  fluid intake to prevent dehydration and lightheadedness. -Continue current breastfeeding practices and increase fluid intake as needed.  Transition of Care Patient expressed a desire to transition care from Dr. Allyson Sabal to me.  Discussed the need for courtesy communication to Dr. Allyson Sabal regarding this transition. -Patient to send a message indicating her desire to transition care. -Schedule future appointments with the current provider.  Follow-up will be in 9 months.  At that time we will get a repeat echocardiogram.  Patient Instructions  Medication Instructions:  NO CHANGES    Lab Work: NONE    Testing/Procedures: NONE   Follow-Up: At [facility], you and your health needs are our priority.  As part of our continuing mission to provide you with exceptional heart care, we have created designated Provider Care Teams.  These Care Teams include your primary Cardiologist (physician) and Advanced Practice Providers (APPs -  Physician Assistants and Nurse Practitioners) who all work together to provide you with the care you need, when you need it.    Your next appointment:   9 MONTHS   Provider:   DR. Thomasene Ripple      Dispo:  Return in about 9 months (around 01/22/2024).   Medication Adjustments/Labs and Tests Ordered: Current medicines are reviewed at length with the patient today.  Concerns regarding medicines are outlined above.  Tests Ordered: No orders of the defined types were placed in this encounter.  Medication Changes: No orders of the defined types were placed in this encounter.

## 2023-04-30 ENCOUNTER — Telehealth (HOSPITAL_COMMUNITY): Payer: Self-pay | Admitting: *Deleted

## 2023-04-30 NOTE — Telephone Encounter (Signed)
04/30/2023  Name: SUMAIYAH GUT MRN: [id number] DOB: 1991-01-04  Reason for Call:  Transition of Care Hospital Discharge Call  Contact Status: Patient Contact Status: Unable to contact ("voice mail box not set up", unable to leave a message)  Language assistant needed:          Follow-Up Questions:    Inocente Salles Postnatal Depression Scale:  In the Past 7 Days:    PHQ2-9 Depression Scale:     Discharge Follow-up:    Post-discharge interventions: NA  Salena Saner, RN 04/30/2023 11:21

## 2024-02-01 ENCOUNTER — Ambulatory Visit (HOSPITAL_COMMUNITY)
Admission: RE | Admit: 2024-02-01 | Discharge: 2024-02-01 | Disposition: A | Discharge status: Home / Self Care | Payer: Self-pay | Source: Ambulatory Visit | Attending: Cardiology | Admitting: Cardiology

## 2024-02-01 DIAGNOSIS — I059 Rheumatic mitral valve disease, unspecified: Secondary | ICD-10-CM | POA: Diagnosis present

## 2024-02-01 LAB — ECHOCARDIOGRAM COMPLETE
Area-P 1/2: 2.76 cm2
MV M vel: 4.69 m/s
MV Peak grad: 88 mmHg
Radius: 0.4 cm
S' Lateral: 2.8 cm

## 2024-02-03 ENCOUNTER — Ambulatory Visit: Payer: Self-pay | Admitting: Cardiovascular Disease

## 2024-02-14 ENCOUNTER — Encounter: Payer: Self-pay | Admitting: Cardiology

## 2024-02-14 ENCOUNTER — Telehealth: Payer: Self-pay | Admitting: Cardiology

## 2024-02-14 NOTE — Telephone Encounter (Signed)
 Patient stated she received her echocardiogram results through MyChart and wants a call back to discuss.

## 2024-02-14 NOTE — Telephone Encounter (Signed)
 Left message stating: Echo results showed: A normal echocardiogram without evidence of an atrial shunt means the ultrasound of the heart shows no abnormal openings or connections between the left and right atria.  Left call back number.

## 2024-02-19 ENCOUNTER — Ambulatory Visit: Payer: Self-pay | Admitting: Cardiology

## 2024-07-09 ENCOUNTER — Encounter: Payer: Self-pay | Admitting: Cardiology

## 2024-08-06 ENCOUNTER — Ambulatory Visit: Admitting: Cardiology

## 2024-08-06 ENCOUNTER — Encounter: Payer: Self-pay | Admitting: Cardiology

## 2024-08-06 VITALS — BP 108/64 | HR 71 | Ht 62.0 in | Wt 110.0 lb

## 2024-08-06 DIAGNOSIS — E782 Mixed hyperlipidemia: Secondary | ICD-10-CM

## 2024-08-06 DIAGNOSIS — Z79899 Other long term (current) drug therapy: Secondary | ICD-10-CM

## 2024-08-06 DIAGNOSIS — I059 Rheumatic mitral valve disease, unspecified: Secondary | ICD-10-CM

## 2024-08-06 DIAGNOSIS — I7090 Unspecified atherosclerosis: Secondary | ICD-10-CM

## 2024-08-06 MED ORDER — ATORVASTATIN CALCIUM 10 MG PO TABS: 10.0000 mg | ORAL_TABLET | Freq: Every day | ORAL | 3 refills | Status: AC

## 2024-08-06 NOTE — Patient Instructions (Signed)
 Medication Instructions:  Your physician has recommended you make the following change in your medication: START: Lipitor 10 mg once nightly  *If you need a refill on your cardiac medications before your next appointment, please call your pharmacy*  Lab Work: TODAY: LFTs, Lp(a) In 12 weeks: LFTs, Lipids If you have labs (blood work) drawn today and your tests are completely normal, you will receive your results only by: MyChart Message (if you have MyChart) OR A paper copy in the mail If you have any lab test that is abnormal or we need to change your treatment, we will call you to review the results.  Testing/Procedures: Dr. Sheena has ordered a CT coronary calcium  score.   Test locations:  [facility]  [facility] at [facility]  This is $99 out of pocket.   Coronary CalciumScan A coronary calcium  scan is an imaging test used to look for deposits of calcium  and other fatty materials (plaques) in the inner lining of the blood vessels of the heart (coronary arteries). These deposits of calcium  and plaques can partly clog and narrow the coronary arteries without producing any symptoms or warning signs. This puts a person at risk for a heart attack. This test can detect these deposits before symptoms develop. Tell a health care provider about: Any allergies you have. All medicines you are taking, including vitamins, herbs, eye drops, creams, and over-the-counter medicines. Any problems you or family members have had with anesthetic medicines. Any blood disorders you have. Any surgeries you have had. Any medical conditions you have. Whether you are pregnant or may be pregnant. What are the risks? Generally, this is a safe procedure. However, problems may occur, including: Harm to a pregnant woman and her unborn baby. This test involves the use of radiation. Radiation  exposure can be dangerous to a pregnant woman and her unborn baby. If you are pregnant, you generally should not have this procedure done. Slight increase in the risk of cancer. This is because of the radiation involved in the test. What happens before the procedure? No preparation is needed for this procedure. What happens during the procedure? You will undress and remove any jewelry around your neck or chest. You will put on a hospital gown. Sticky electrodes will be placed on your chest. The electrodes will be connected to an electrocardiogram (ECG) machine to record a tracing of the electrical activity of your heart. A CT scanner will take pictures of your heart. During this time, you will be asked to lie still and hold your breath for 2-3 seconds while a picture of your heart is being taken. The procedure may vary among health care providers and hospitals. What happens after the procedure? You can get dressed. You can return to your normal activities. It is up to you to get the results of your test. Ask your health care provider, or the department that is doing the test, when your results will be ready. Summary A coronary calcium  scan is an imaging test used to look for deposits of calcium  and other fatty materials (plaques) in the inner lining of the blood vessels of the heart (coronary arteries). Generally, this is a safe procedure. Tell your health care provider if you are pregnant or may be pregnant. No preparation is needed for this procedure. A CT scanner will take pictures of your heart. You can return to your normal activities after the scan is  done. This information is not intended to replace advice given to you by your health care provider. Make sure you discuss any questions you have with your health care provider. Document Released: 12/16/2007 Document Revised: 05/08/2016 Document Reviewed: 05/08/2016 Elsevier Interactive Patient Education  2017 [employer].   Follow-Up: At [facility], you and your health needs are our priority.  As part of our continuing mission to provide you with exceptional heart care, our providers are all part of one team.  This team includes your primary Cardiologist (physician) and Advanced Practice Providers or APPs (Physician Assistants and Nurse Practitioners) who all work together to provide you with the care you need, when you need it.  Your next appointment:   12 week(s) via MyChart  Provider:   [name] Tobb, DO    Other Instructions:

## 2024-08-08 ENCOUNTER — Telehealth: Payer: Self-pay | Admitting: Cardiology

## 2024-08-08 LAB — HEPATIC FUNCTION PANEL
ALT: 12 [IU]/L (ref 0–32)
AST: 17 [IU]/L (ref 0–40)
Albumin: 4.8 g/dL (ref 3.9–4.9)
Alkaline Phosphatase: 42 [IU]/L (ref 41–116)
Bilirubin Total: 0.3 mg/dL (ref 0.0–1.2)
Bilirubin, Direct: 0.08 mg/dL (ref 0.00–0.40)
Total Protein: 7 g/dL (ref 6.0–8.5)

## 2024-08-08 LAB — LIPOPROTEIN A (LPA)

## 2024-08-08 NOTE — Telephone Encounter (Signed)
 Patient sent message via Mychart!! Pt calling to follow up   I should also clarify that I was not planning on weaning any time soon so I need to know if I need to have a plan to do so . Thank you Kayla Everett LABOR Blaszczyk to [facility] (supporting [employer], DO) TA    08/07/24  9:54 AM Ok. I guess I need to know how important is it that I need to start this medication? She made it sound like it wasnt really an option during our apt. Or is there another medication I can take that is safe? Thank you [name] CROME, RN to [employer]     08/07/24  9:39 AM Dr. Sheena replied: Thanks for reaching out and letting me know that you are still breast-feeding. No the Lipitor is not safe with breast-feeding so once you stop breast-feeding we can restart the Lipitor.   Last read by Kayla Everett LABOR [place] at 9:10AM on 08/08/2024. Kayla Everett, [name], DO to [facility]  Kayla Everett HERO, RN     08/07/24  9:14 AM Thanks for reaching out and letting me know that you are still breast-feeding.  No the Lipitor is not safe with breast-feeding so once you stop breast-feeding we can restart the Lipitor. [name], RN to Kayla Everett, [name], DO  Kayla Everett, Kayla M, RN     08/07/24  8:35 AM Pt says she forgot to mention she was still breastfeeding and wanted to make sure you are okay with her taking Lipitor while doing so. Please advise. Thank you [name], RN to [employer]     08/07/24  8:34 AM Good morning,  I will send this to Dr Sheena for updates and any recommendations she may have regarding taking the Lipitor while breastfeeding. Someone will be in touch with you once she reviews. Thank you,   [name], RN [facility] Triage   Last read by Kayla Everett LABOR Roussin at 9:10AM on 08/08/2024. Kayla Everett A Villarin to [facility] (supporting Kayla Everett Tobb, DO) TA    08/06/24  5:44 PM Hi Dr. Sheena, I forgot to mention during our apt today that Im still breastfeeding. I just wanted to make  sure that Lipitor was safe to take while breastfeeding.    I appreciate it,

## 2024-08-20 ENCOUNTER — Other Ambulatory Visit (HOSPITAL_COMMUNITY)

## 2024-09-18 ENCOUNTER — Ambulatory Visit: Admitting: Cardiology

## 2024-10-31 ENCOUNTER — Ambulatory Visit: Admitting: Cardiology
